# Patient Record
Sex: Female | Born: 1975 | Race: White | Hispanic: No | Marital: Single
Health system: Midwestern US, Academic
[De-identification: ages and names within clinical notes are randomized; demographics above are authoritative.]

---

## 2016-11-24 ENCOUNTER — Encounter: Admit: 2016-11-24 | Discharge: 2016-11-24 | Payer: BC Managed Care – PPO

## 2016-11-24 ENCOUNTER — Emergency Department: Admit: 2016-11-24 | Discharge: 2016-11-24 | Payer: BC Managed Care – PPO

## 2016-11-24 DIAGNOSIS — S42401K Unspecified fracture of lower end of right humerus, subsequent encounter for fracture with nonunion: ICD-10-CM

## 2016-11-24 DIAGNOSIS — S060X1A Concussion with loss of consciousness of 30 minutes or less, initial encounter: Principal | ICD-10-CM

## 2016-11-24 DIAGNOSIS — S12091A Other nondisplaced fracture of first cervical vertebra, initial encounter for closed fracture: ICD-10-CM

## 2016-11-24 MED ORDER — LACTATED RINGERS IV SOLP
1000 mL | INTRAVENOUS | 0 refills | Status: CP
Start: 2016-11-24 — End: ?
  Administered 2016-11-25: 05:00:00 1000 mL via INTRAVENOUS

## 2016-11-24 MED ORDER — FENTANYL CITRATE (PF) 50 MCG/ML IJ SOLN
100 ug | Freq: Once | INTRAVENOUS | 0 refills | Status: CP
Start: 2016-11-24 — End: ?
  Administered 2016-11-25: 04:00:00 100 ug via INTRAVENOUS

## 2016-11-24 MED ORDER — IOPAMIDOL 76 % IV SOLN
80 mL | Freq: Once | INTRAVENOUS | 0 refills | Status: CP
Start: 2016-11-24 — End: ?
  Administered 2016-11-25: 02:00:00 80 mL via INTRAVENOUS

## 2016-11-24 MED ORDER — SODIUM CHLORIDE 0.9 % IJ SOLN
50 mL | Freq: Once | INTRAVENOUS | 0 refills | Status: CP
Start: 2016-11-24 — End: ?
  Administered 2016-11-25: 02:00:00 50 mL via INTRAVENOUS

## 2016-11-24 MED ORDER — FENTANYL CITRATE (PF) 50 MCG/ML IJ SOLN
100 ug | Freq: Once | INTRAVENOUS | 0 refills | Status: CP
Start: 2016-11-24 — End: ?

## 2016-11-25 ENCOUNTER — Encounter: Admit: 2016-11-25 | Discharge: 2016-11-25 | Payer: BC Managed Care – PPO

## 2016-11-25 ENCOUNTER — Inpatient Hospital Stay: Admit: 2016-11-25 | Discharge: 2016-11-25 | Payer: BC Managed Care – PPO

## 2016-11-25 ENCOUNTER — Emergency Department: Admit: 2016-11-25 | Discharge: 2016-11-25 | Payer: BC Managed Care – PPO

## 2016-11-25 DIAGNOSIS — S060X1A Concussion with loss of consciousness of 30 minutes or less, initial encounter: Principal | ICD-10-CM

## 2016-11-25 LAB — TEG WITH KAOLIN
Lab: 0.4 % (ref <8.1)
Lab: 1.4 min (ref <3.1)
Lab: 3.6 min (ref <9.1)
Lab: 5 min (ref <12.1)
Lab: 67 mm (ref >49.9)
Lab: 70 DEG (ref >54.9)

## 2016-11-25 LAB — BASIC METABOLIC PANEL
Lab: 0.7 mg/dL (ref 0.4–1.00)
Lab: 104 MMOL/L (ref 98–110)
Lab: 104 mg/dL — ABNORMAL HIGH (ref 70–100)
Lab: 134 MMOL/L — ABNORMAL LOW (ref 137–147)
Lab: 14 % — ABNORMAL HIGH (ref 3–12)
Lab: 15 mg/dL (ref 7–25)
Lab: 16 MMOL/L — ABNORMAL LOW (ref 21–30)
Lab: 8.4 mg/dL — ABNORMAL LOW (ref 8.5–10.6)
Lab: >60 mL/min (ref >60)
Lab: >60 mL/min (ref >60)
Lab: 135 MMOL/L — ABNORMAL LOW (ref 137–147)
Lab: 4.1 MMOL/L (ref 3.5–5.1)
Lab: 8.5 mg/dL (ref 8.5–10.6)
Lab: >60 mL/min (ref >60)

## 2016-11-25 LAB — CANNABINOIDS-URINE RANDOM: Lab: NEGATIVE

## 2016-11-25 LAB — PHOSPHORUS: Lab: 3.3 mg/dL (ref 2.0–4.5)

## 2016-11-25 LAB — LACTIC ACID (BG - RAPID LACTATE)
Lab: 3.2 MMOL/L — ABNORMAL HIGH (ref 0.5–2.0)
Lab: 3 MMOL/L — ABNORMAL HIGH (ref 0.5–2.0)

## 2016-11-25 LAB — PROTIME INR (PT): Lab: 1 M/UL (ref 0.8–1.2)

## 2016-11-25 LAB — ALCOHOL LEVEL: Lab: 154 mg/dL

## 2016-11-25 LAB — CBC
Lab: 16 10*3/uL — ABNORMAL HIGH (ref 4.5–11.0)
Lab: 13 % (ref 11–15)
Lab: 169 10*3/uL (ref 150–400)
Lab: 30 pg (ref 26–34)
Lab: 34 g/dL (ref 32.0–36.0)
Lab: 36 % (ref 36–45)
Lab: 88 FL (ref 80–100)
Lab: 9.2 FL (ref 7–11)
Lab: 9.6 10*3/uL (ref 4.5–11.0)

## 2016-11-25 LAB — COCAINE-URINE RANDOM: Lab: NEGATIVE

## 2016-11-25 LAB — PTT (APTT): Lab: 21 s (ref 20.0–36.0)

## 2016-11-25 LAB — BARBITURATES-URINE RANDOM: Lab: NEGATIVE

## 2016-11-25 LAB — BENZODIAZEPINES-URINE RANDOM: Lab: NEGATIVE

## 2016-11-25 LAB — PHENCYCLIDINES-URINE RANDOM: Lab: NEGATIVE

## 2016-11-25 LAB — OPIATES-URINE RANDOM: Lab: NEGATIVE

## 2016-11-25 LAB — MAGNESIUM: Lab: 1.6 mg/dL (ref 1.6–2.6)

## 2016-11-25 LAB — AMPHETAMINES-URINE RANDOM: Lab: NEGATIVE

## 2016-11-25 MED ORDER — FENTANYL CITRATE (PF) 50 MCG/ML IJ SOLN
25-50 ug | INTRAVENOUS | 0 refills | Status: DC | PRN
Start: 2016-11-25 — End: 2016-11-25
  Administered 2016-11-25: 08:00:00 50 ug via INTRAVENOUS

## 2016-11-25 MED ORDER — ENOXAPARIN 40 MG/0.4 ML SC SYRG
40 mg | Freq: Two times a day (BID) | SUBCUTANEOUS | 0 refills | Status: DC
Start: 2016-11-25 — End: 2016-11-25

## 2016-11-25 MED ORDER — ONDANSETRON HCL (PF) 4 MG/2 ML IJ SOLN
4-8 mg | INTRAVENOUS | 0 refills | Status: DC | PRN
Start: 2016-11-25 — End: 2016-11-29

## 2016-11-25 MED ORDER — GABAPENTIN 100 MG PO CAP
200 mg | ORAL | 0 refills | Status: DC
Start: 2016-11-25 — End: 2016-11-28
  Administered 2016-11-25 – 2016-11-28 (×9): 200 mg via ORAL

## 2016-11-25 MED ORDER — FENTANYL CITRATE (PF) 50 MCG/ML IJ SOLN
25-50 ug | INTRAVENOUS | 0 refills | Status: DC | PRN
Start: 2016-11-25 — End: 2016-11-29
  Administered 2016-11-27: 02:00:00 25 ug via INTRAVENOUS
  Administered 2016-11-28: 18:00:00 50 ug via INTRAVENOUS

## 2016-11-25 MED ORDER — ACETAMINOPHEN 500 MG PO TAB
1000 mg | ORAL | 0 refills | Status: DC
Start: 2016-11-25 — End: 2016-11-29
  Administered 2016-11-26 – 2016-11-29 (×10): 1000 mg via ORAL

## 2016-11-25 MED ORDER — METHOCARBAMOL 500 MG PO TAB
500 mg | Freq: Three times a day (TID) | ORAL | 0 refills | Status: DC
Start: 2016-11-25 — End: 2016-11-27
  Administered 2016-11-25 – 2016-11-27 (×5): 500 mg via ORAL

## 2016-11-25 MED ORDER — ENOXAPARIN 30 MG/0.3 ML SC SYRG
30 mg | Freq: Two times a day (BID) | SUBCUTANEOUS | 0 refills | Status: DC
Start: 2016-11-25 — End: 2016-11-29
  Administered 2016-11-26 – 2016-11-29 (×5): 30 mg via SUBCUTANEOUS

## 2016-11-25 MED ORDER — ACETAMINOPHEN 325 MG PO TAB
650 mg | ORAL | 0 refills | Status: DC | PRN
Start: 2016-11-25 — End: 2016-11-25

## 2016-11-25 MED ORDER — ENOXAPARIN 40 MG/0.4 ML SC SYRG
40 mg | Freq: Every day | SUBCUTANEOUS | 0 refills | Status: DC
Start: 2016-11-25 — End: 2016-11-25

## 2016-11-25 MED ORDER — POLYETHYLENE GLYCOL 3350 17 GRAM PO PWPK
1 | Freq: Every day | ORAL | 0 refills | Status: DC
Start: 2016-11-25 — End: 2016-11-28

## 2016-11-25 MED ORDER — ENOXAPARIN 30 MG/0.3 ML SC SYRG
30 mg | Freq: Two times a day (BID) | SUBCUTANEOUS | 0 refills | Status: DC
Start: 2016-11-25 — End: 2016-11-25

## 2016-11-25 MED ORDER — GADOBENATE DIMEGLUMINE 529 MG/ML (0.1MMOL/0.2ML) IV SOLN
15 mL | Freq: Once | INTRAVENOUS | 0 refills | Status: CP
Start: 2016-11-25 — End: ?
  Administered 2016-11-25: 19:00:00 15 mL via INTRAVENOUS

## 2016-11-25 MED ORDER — OXYCODONE 5 MG PO TAB
5-15 mg | ORAL | 0 refills | Status: DC | PRN
Start: 2016-11-25 — End: 2016-11-29
  Administered 2016-11-25 – 2016-11-26 (×4): 15 mg via ORAL
  Administered 2016-11-26: 12:00:00 10 mg via ORAL
  Administered 2016-11-27 – 2016-11-28 (×4): 15 mg via ORAL
  Administered 2016-11-28: 11:00:00 10 mg via ORAL
  Administered 2016-11-29 (×5): 15 mg via ORAL

## 2016-11-25 MED ORDER — MAGNESIUM SULFATE IN WATER 4 GRAM/50 ML (8 %) IV PGBK
4 g | Freq: Once | INTRAVENOUS | 0 refills | Status: CP
Start: 2016-11-25 — End: ?
  Administered 2016-11-25: 17:00:00 4 g via INTRAVENOUS

## 2016-11-25 MED ORDER — MELATONIN 3 MG PO TAB
3 mg | Freq: Every evening | ORAL | 0 refills | Status: DC
Start: 2016-11-25 — End: 2016-11-29
  Administered 2016-11-26 – 2016-11-29 (×4): 3 mg via ORAL

## 2016-11-25 MED ORDER — SENNOSIDES-DOCUSATE SODIUM 8.6-50 MG PO TAB
2 | Freq: Two times a day (BID) | ORAL | 0 refills | Status: DC
Start: 2016-11-25 — End: 2016-11-29
  Administered 2016-11-26 – 2016-11-29 (×5): 2 via ORAL

## 2016-11-25 MED ORDER — KETAMINE 10 MG/ML IJ SOLN
25 mg | Freq: Once | INTRAVENOUS | 0 refills | Status: CP
Start: 2016-11-25 — End: ?
  Administered 2016-11-25: 05:00:00 25 mg via INTRAVENOUS

## 2016-11-25 MED ORDER — PROPOFOL 10 MG/ML IV EMUL
25 mg | INTRAVENOUS | 0 refills | Status: DC
Start: 2016-11-25 — End: 2016-11-25
  Administered 2016-11-25 (×4): 25 mg via INTRAVENOUS

## 2016-11-25 MED ORDER — LACTATED RINGERS IV SOLP
INTRAVENOUS | 0 refills | Status: DC
Start: 2016-11-25 — End: 2016-11-25
  Administered 2016-11-25: 09:00:00 1000.000 mL via INTRAVENOUS

## 2016-11-25 MED ORDER — OXYCODONE 5 MG PO TAB
5-10 mg | ORAL | 0 refills | Status: DC | PRN
Start: 2016-11-25 — End: 2016-11-25
  Administered 2016-11-25: 14:00:00 10 mg via ORAL

## 2016-11-25 MED ADMIN — FENTANYL CITRATE (PF) 50 MCG/ML IJ SOLN [3037]: 100 ug | INTRAVENOUS | @ 03:00:00 | Stop: 2016-11-25 | NDC 00409909409

## 2016-11-25 NOTE — ED Notes
Patient Belongings:    Pair of underwear  Cut bra  Cut T-shirt  Cut long undershirt  cut sweatshirt   Cut [place] jacket  Knit beanie  Mints

## 2016-11-25 NOTE — Other
11/24/2016 [place], [place] [id number]    Attending of Record: Allyson Sabal  Performing Provider: Mare Ludtke    Procedure: Limited Abdominal Ultrasound (Trauma FAST exam)    Areas viewed: Right Lateral window, Left Lateral window, Sub-xyphoid/pericardial window and Pelvic window    Indication for Procedure: blunt abdominal trauma    Findings: Negx4    Plan from Findings: Proceed with workup     Durwin Reges, MD

## 2016-11-25 NOTE — Progress Notes
PHYSICAL THERAPY  NOTE         Patient was unavailable for physical therapy-fitting for Aspen collar, off unit to MRI.  Physical therapy will continue to follow and provide intervention as indicated.    Therapist: Era Bumpers, PT  Date: 11/25/2016

## 2016-11-25 NOTE — ED Notes
Pt report called to [employer] for [id number].

## 2016-11-25 NOTE — ED Notes
Report to Tim, RN
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
## 2016-11-25 NOTE — Progress Notes
Patient arrived to room # (5113/1) via cart accompanied by RN. Patient transferred to the bed with assistance. Bedside safety checks completed. Initial patient assessment completed, refer to flowsheet for details. Admission skin assessment completed by:   Vania Rea and Shawn B.  Pressure Injury Present on Hospital Admission (within 24 hours): No    1. Occiput: No  2. Ear: No  3. Scapula: No  4. Spinous Process: No  5. Shoulder: No  6. Elbow: No  7. Iliac Crest: No  8. Sacrum/Coccyx: No  9. Ischial Tuberosity: No  10. Trochanter: No  11. Knee: No  12. Malleolus: No  13. Heel: No  14. Toes: No  15. Assessed for device associated injury No  16. Nursing Nutrition Assessment Completed No    See Doc Flowsheet for additional wound details.     INTERVENTIONS:

## 2016-11-25 NOTE — ED Notes
Pt's sister, Karoline Caldwell, at the bedside

## 2016-11-25 NOTE — Progress Notes
OCCUPATIONAL THERAPY  ASSESSMENT NOTE    Patient Name: Krystal Cruz                   Room/Bed: JY7829/56  Admitting Diagnosis:   No past medical history on file.    Mobility  Progressive Mobility Level: Walk in hallway  Distance Walked (feet): 100 ft  Level of Assistance: Assist X1  Assistive Device: None  Time Tolerated: 0-10 minutes  Activity Limited By: Fatigue    Subjective  Pertinent Dx per Physician: 41 year old female w/ R elbow dislocation w/ associated coronoid fracture and gross instability s/p closed reduction 9/30  Precautions: Standard;Falls;Cervical Collar on at All Times  UE Precautions: RUE Non Weight Bearing;RUE Sling  Pain / Complaints: Patient agrees to participate in therapy  Pain Location: Right;Arm  Pain Level Current:  (Patient did not rate)  Comments: Patient presented supine in bed upon therapist arrival; patient agreeable to OT evaluation. Patient left supine in bed with all needs addressed    Objective  Psychosocial Status: Willing and Cooperative to Participate  Persons Present: Family    Home Living  Type of Home: House  Home Layout: Two Level;Able to Live on Main Level w/Bedrm/Bathrm Access;Stairs to Enter w/ [employer] / Tub: Pension scheme manager: Standard    Prior Function  Level Of Independence: Independent with ADLs and functional transfers;Independent with homemaking w/ ambulation  Lives With: Alone  Other Function Comments: Patient reported that she will be able to have family support upon d/c    Vision  Current Vision: No Visual Deficits    ADL's  Eating Assist: Minimal Assist  Eating Deficits: Beverage Management;Setup  LE Dressing Assist: Total Assist  LE Dressing Deficits: Don/Doff R Sock;Don/Doff L Sock  Functional Transfer Assist: Stand By Assist (CGA)  Comment: Patient SBA to CGA for all functional mobility; min assist required one time for a minor loss of balance. Patient total assist for LE dressing as she did not want to attempt, however, believe she could have done so with min assist. Patient declined further ADLs, ambulating in hallway, then returned to bed with meal setup. Planned OR monday 10/01 for fixation of RUE with ortho.    Cognition  Overall Cognitive Status: WNL    UE AROM  Comment: LUE WNL; RUE not assessed    Edema  RUE Edema: Mild Edema  LUE Edema: No Significant Edema  R Hand Edema: Mild Edema  L Hand Edema: No Significant Edema  Comment: RUE elevated upon therapist departure. Patient able to wiggle all fingers on R hand.    Sensory  Comment: Patient reports numbness/tingling in R hand    UE Strength / Tone  Comment: LUE WNL (not formally assesseD); RUE not assessed due to precautions    Education  Persons Educated: Patient/Family  Barriers To Learning: None Noted  Teaching Methods: Verbal Instruction;Demonstration  Patient Response: Verbalized and Demo Understanding  Topics: Role of OT, Goals for Therapy  Goal Formulation: With Patient/Family    Assessment  Assessment: Decreased ADL Status;Decreased Endurance;Decreased Self-Care Trans;Decreased High-Level ADLs  Prognosis: Good;w/ Family  Goal Formulation: Pt/family    AM-PAC 6 Clicks Daily Activity Inpatient  Putting on and taking off regular lower body clothes?: A Lot  Bathing (Including washing, rinsing, drying): A Little  Toileting, which includes using toilet, bedpan, or urinal: A Little  Putting on and taking off regular upper body clothing: A Little  Taking care of personal grooming such as brushing teeth: A  Little  Eating meals?: A Little  Daily Activity Raw Score: 17  Standardized (t-scale) score: 37.26  CMS 0-100% Score: 50.11  CMS G Code Modifier: CK    Plan   OT Frequency: 5x/week  OT Plan for Next Visit: Follow-up post-op for RE     ADL Goals  Patient Will Perform Grooming: Standing at Sink;w/ Stand By Assist  Patient Will Perform LE Dressing: At [facility] of Bed;In Chair;w/ Minimum Assist Patient Will Perform Toileting: w/ Stand By Assist    Functional Transfer Goals  Pt Will Perform All Functional Transfers: Independent    OT Discharge Recommendations  OT Discharge Recommendations: Home with family assist  Equipment Recommendations: Too early to be determined    Therapist: Bertram Millard, OTR/L [phone]  Date: 11/25/2016

## 2016-11-25 NOTE — Progress Notes
Chaplain Note: Chaplain followed up with pt and family. Pt is in ED 37, prayed with her at her request.    Admit Date: 11/24/2016       Please page or use consult order if pt requests visit. Chaplain will continue to follow.       Date/Time:                      User:                                   11/24/2016 10:46 PM Carroll Kinds      PCU 3 PCU
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
## 2016-11-25 NOTE — ED Notes
BH [phone] (ready)

## 2016-11-25 NOTE — Progress Notes
Orthopedic Surgery Progress Note    Name: Is Northcarolinai   Length of stay: LOS: 0 days    S: No acute events.  Pain well-controlled.      O:  Blood pressure 98/66, pulse 81, temperature 36.9 ???C (98.4 ???F), height 167.6 cm (66), weight 81.6 kg (179 lb 14.3 oz), SpO2 97 %.     General: AAOx3, NAD  Cardiac: RRR  Respirations: Unlabored  Abdomen: soft, nt  Extremities: RUE: splint in place, arm in full pronation. SILT m/u/r nerve distributions. AIN/PIN/ulnar nerve intact. Fingers wwp. Cap refill < 3 sec.  Incisions:  none    Post-op Drains: (24 hours): none    Lab/Radiology/Other Diagnostic Tests:  CBC w/Diff   Lab Results   Component Value Date/Time    WBC 16.7 (H) 11/24/2016 10:05 PM    HGB 13.5 11/24/2016 10:05 PM    HCT 40.7 11/24/2016 10:05 PM    PLTCT 189 11/24/2016 10:05 PM        Inflammatory Markers   No results found for: ESR, CRP     Coagulation Studies   Lab Results   Component Value Date/Time    PTT 21.8 11/24/2016 10:05 PM    INR 1.0 11/24/2016 10:05 PM        Basic Metabolic Profile   Lab Results   Component Value Date/Time    NA 134 (L) 11/24/2016 10:05 PM    K 3.9 11/24/2016 10:05 PM    CL 104 11/24/2016 10:05 PM    CO2 16 (L) 11/24/2016 10:05 PM    GAP 14 (H) 11/24/2016 10:05 PM    BUN 15 11/24/2016 10:05 PM    CR 0.74 11/24/2016 10:05 PM    GLU 104 (H) 11/24/2016 10:05 PM        Radiology: reviewed      Assessment Is Northcarolinai is a 41 year old female w/ R elbow dislocation w/ associated coronoid fracture and gross instability s/p closed reduction 9/30    Plan  WB Status - NWB RUE  ROM Status - maintain splint in full pronaiton  Antibiotics - none necessary from ortho standpoint  Pain Control per primary  Diet please keep NPO @ MN for possible OR tomorrow  PT/OT - consulted  DVT PPX - Mechanical, Chemoprophylaxis per primary  Dispo - Will plan for operative fixation of R elbow 10/1. Patient is consented. Will discuss at conference tomorrow morning re: treating surgeon Will discuss patient with Staff Orthopedic Surgeon Dr. Hattie Perch.    Frances Maywood, MD  Pager [phone]

## 2016-11-26 ENCOUNTER — Encounter: Admit: 2016-11-26 | Discharge: 2016-11-26 | Payer: BC Managed Care – PPO

## 2016-11-26 ENCOUNTER — Inpatient Hospital Stay: Admit: 2016-11-26 | Discharge: 2016-11-26 | Payer: BC Managed Care – PPO

## 2016-11-26 LAB — BASIC METABOLIC PANEL
Lab: 104 MMOL/L — ABNORMAL LOW (ref >60)
Lab: 136 MMOL/L — ABNORMAL LOW (ref 137–147)
Lab: 7 pg (ref 3–12)

## 2016-11-26 LAB — MAGNESIUM: Lab: 1.8 mg/dL — ABNORMAL LOW (ref >60)

## 2016-11-26 LAB — PHOSPHORUS: Lab: 2.6 mg/dL (ref >60)

## 2016-11-26 MED ORDER — DIPHENHYDRAMINE(#)/LIDOCAINE/ANTACID 1:1:1 PO SUSP
10 mL | Freq: Three times a day (TID) | ORAL | 0 refills | Status: DC
Start: 2016-11-26 — End: 2016-11-27
  Administered 2016-11-27: 05:00:00 10 mL via ORAL

## 2016-11-26 MED ORDER — BACITRACIN ZINC 500 UNIT/GRAM TP OINT
Freq: Two times a day (BID) | TOPICAL | 0 refills | Status: DC
Start: 2016-11-26 — End: 2016-11-29
  Administered 2016-11-27: 13:00:00 via TOPICAL

## 2016-11-26 MED ORDER — LACTATED RINGERS IV SOLP
1000 mL | INTRAVENOUS | 0 refills | Status: DC
Start: 2016-11-26 — End: 2016-11-28
  Administered 2016-11-26: 22:00:00 1000 mL via INTRAVENOUS
  Administered 2016-11-27 – 2016-11-28 (×2): 1000.000 mL via INTRAVENOUS

## 2016-11-26 MED ORDER — FLU VACC QS2018-19 6MOS UP(PF) 60 MCG (15 MCG X 4)/0.5 ML IM SYRG
.5 mL | Freq: Once | INTRAMUSCULAR | 0 refills | Status: DC
Start: 2016-11-26 — End: 2016-11-29

## 2016-11-26 NOTE — Progress Notes
OCCUPATIONAL THERAPY  NOTE    Patient to OR this date with ortho. OT will follow-up post-operatively to provide intervention as indicated.    Iisha Soyars, OTR/L [id number]

## 2016-11-26 NOTE — Progress Notes
Orthopedic Surgery Progress Note    Name: Is Northcarolinai   Length of stay: LOS: 1 day    S: No acute events.  Pain well-controlled.      O:  Blood pressure 104/62, pulse 75, temperature 36.7 ???C (98.1 ???F), height 167.6 cm (66), weight 81.6 kg (179 lb 14.3 oz), SpO2 100 %.     General: Alert, NAD  Cardiac: RRR  Respirations: Unlabored  Abdomen: soft, nt  Extremities: RUE: splint in place, arm in full pronation. SILT m/u/r nerve distributions. AIN/PIN/ulnar nerve intact. Fingers wwp. Cap refill < 3 sec. TTP over DIP small/ring/5th MC  Incisions:  none    Post-op Drains: (24 hours): none    Lab/Radiology/Other Diagnostic Tests:  CBC w/Diff   Lab Results   Component Value Date/Time    WBC 9.6 11/25/2016 07:24 AM    HGB 12.6 11/25/2016 07:24 AM    HCT 36.9 11/25/2016 07:24 AM    PLTCT 169 11/25/2016 07:24 AM        Inflammatory Markers   No results found for: ESR, CRP     Coagulation Studies   Lab Results   Component Value Date/Time    PTT 21.8 11/24/2016 10:05 PM    INR 1.0 11/24/2016 10:05 PM        Basic Metabolic Profile   Lab Results   Component Value Date/Time    NA 136 (L) 11/26/2016 05:04 AM    K 3.8 11/26/2016 05:04 AM    CL 104 11/26/2016 05:04 AM    CO2 25 11/26/2016 05:04 AM    GAP 7 11/26/2016 05:04 AM    BUN 13 11/26/2016 05:04 AM    CR 0.63 11/26/2016 05:04 AM    GLU 99 11/26/2016 05:04 AM        Radiology: reviewed      Assessment Is Northcarolinai is a 41 year old female w/ R elbow dislocation w/ associated coronoid fracture and gross instability s/p closed reduction 9/30,  5th metacarpal fx, 4th/5th distal phalanx fxs    Plan  WB Status - NWB RUE  ROM Status - maintain splint in full pronaiton  Possible stack splints and ulnar gutter splint for hand fxs after surgery.   Antibiotics - none necessary from ortho standpoint  Pain Control per primary  Diet please keep NPO   PT/OT - consulted  DVT PPX - Mechanical, Chemoprophylaxis per primary Dispo - Will plan for operative fixation of R elbow 10/1 and possible hand fxs. Patient is consented.       Dickey Gave, MD  Pager [phone]
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
## 2016-11-26 NOTE — Progress Notes
Brief Ortho Note    OR today  Please keep NPO    Krystal Cruz A. Zaxton Angerer, MD  [phone]

## 2016-11-26 NOTE — Progress Notes
PHYSICAL THERAPY  NOTE     Patient to OR this date with ortho. PT will follow-up post-operatively to provide intervention as indicated.      Therapist: Pam Drown, PT  Date: 11/26/2016

## 2016-11-26 NOTE — Progress Notes
Brief Ortho Note    OR today.  Please keep NPO  Posted, consented    Krystal Needle A. Adeoluwa Silvers, MD  [phone]

## 2016-11-27 ENCOUNTER — Encounter: Admit: 2016-11-27 | Discharge: 2016-11-27 | Payer: BC Managed Care – PPO

## 2016-11-27 ENCOUNTER — Inpatient Hospital Stay: Admit: 2016-11-27 | Discharge: 2016-11-27 | Payer: BC Managed Care – PPO

## 2016-11-27 DIAGNOSIS — S060X1A Concussion with loss of consciousness of 30 minutes or less, initial encounter: Principal | ICD-10-CM

## 2016-11-27 LAB — PHOSPHORUS: Lab: 3.5 mg/dL — ABNORMAL LOW (ref >60)

## 2016-11-27 LAB — MAGNESIUM: Lab: 1.9 mg/dL — ABNORMAL LOW (ref 1.6–2.6)

## 2016-11-27 LAB — PREGNANCY TEST-URINE
Lab: 1
Lab: NEGATIVE

## 2016-11-27 LAB — BASIC METABOLIC PANEL: Lab: 139 MMOL/L — ABNORMAL LOW (ref 137–147)

## 2016-11-27 MED ORDER — PROPOFOL INJ 10 MG/ML IV VIAL
0 refills | Status: DC
Start: 2016-11-27 — End: 2016-11-28
  Administered 2016-11-27: 23:00:00 200 mg via INTRAVENOUS

## 2016-11-27 MED ORDER — LIDOCAINE (PF) 200 MG/10 ML (2 %) IJ SYRG
0 refills | Status: DC
Start: 2016-11-27 — End: 2016-11-28
  Administered 2016-11-27: 23:00:00 100 mg via INTRAVENOUS

## 2016-11-27 MED ORDER — LIDOCAINE (PF) 10 MG/ML (1 %) IJ SOLN
.1-2 mL | INTRAMUSCULAR | 0 refills | Status: DC | PRN
Start: 2016-11-27 — End: 2016-11-28

## 2016-11-27 MED ORDER — MIDAZOLAM 1 MG/ML IJ SOLN
INTRAVENOUS | 0 refills | Status: DC
Start: 2016-11-27 — End: 2016-11-28
  Administered 2016-11-27: 23:00:00 2 mg via INTRAVENOUS

## 2016-11-27 MED ORDER — ONDANSETRON HCL (PF) 4 MG/2 ML IJ SOLN
INTRAVENOUS | 0 refills | Status: DC
Start: 2016-11-27 — End: 2016-11-28
  Administered 2016-11-28: 4 mg via INTRAVENOUS

## 2016-11-27 MED ORDER — FENTANYL CITRATE (PF) 50 MCG/ML IJ SOLN
0 refills | Status: DC
Start: 2016-11-27 — End: 2016-11-28
  Administered 2016-11-27 – 2016-11-28 (×8): 25 ug via INTRAVENOUS

## 2016-11-27 MED ORDER — DIPHENHYDRAMINE(#)/LIDOCAINE/ANTACID 1:1:1 PO SUSP
10 mL | Freq: Four times a day (QID) | ORAL | 0 refills | Status: AC | PRN
Start: 2016-11-27 — End: ?

## 2016-11-27 MED ORDER — ACETAMINOPHEN 1,000 MG/100 ML (10 MG/ML) IV SOLN
1000 mg | Freq: Once | INTRAVENOUS | 0 refills | Status: DC
Start: 2016-11-27 — End: 2016-11-28

## 2016-11-27 MED ORDER — OXYCODONE 5 MG PO TAB
5-10 mg | Freq: Once | ORAL | 0 refills | Status: DC | PRN
Start: 2016-11-27 — End: 2016-11-28

## 2016-11-27 MED ORDER — LIDOCAINE (PF) 10 MG/ML (1 %) IJ SOLN
0 refills | Status: DC
Start: 2016-11-27 — End: 2016-11-28
  Administered 2016-11-28: 02:00:00 4 mL

## 2016-11-27 MED ORDER — CEFAZOLIN 1 GRAM IJ SOLR
0 refills | Status: DC
Start: 2016-11-27 — End: 2016-11-28
  Administered 2016-11-27: 23:00:00 2 g via INTRAVENOUS

## 2016-11-27 MED ORDER — METHOCARBAMOL 750 MG PO TAB
750 mg | Freq: Three times a day (TID) | ORAL | 0 refills | Status: DC
Start: 2016-11-27 — End: 2016-11-28
  Administered 2016-11-28 (×3): 750 mg via ORAL

## 2016-11-27 MED ORDER — CEFAZOLIN INJ 1GM IVP
2 g | INTRAVENOUS | 0 refills | Status: CP
Start: 2016-11-27 — End: ?
  Administered 2016-11-28 (×3): 2 g via INTRAVENOUS

## 2016-11-27 MED ORDER — MEPERIDINE (PF) 25 MG/ML IJ SYRG
12.5 mg | INTRAVENOUS | 0 refills | Status: DC | PRN
Start: 2016-11-27 — End: 2016-11-28

## 2016-11-27 MED ORDER — ACETAMINOPHEN 1,000 MG/100 ML (10 MG/ML) IV SOLN
0 refills | Status: DC
Start: 2016-11-27 — End: 2016-11-28
  Administered 2016-11-28: 01:00:00 1000 mg via INTRAVENOUS

## 2016-11-27 MED ORDER — FENTANYL CITRATE (PF) 50 MCG/ML IJ SOLN
50 ug | INTRAVENOUS | 0 refills | Status: DC | PRN
Start: 2016-11-27 — End: 2016-11-28
  Administered 2016-11-28 (×2): 50 ug via INTRAVENOUS

## 2016-11-27 MED ORDER — DEXMEDETOMIDINE# 4MCG/ML IV SOLN
0 refills | Status: DC
Start: 2016-11-27 — End: 2016-11-28
  Administered 2016-11-28 (×3): 4 ug via INTRAVENOUS
  Administered 2016-11-28: 02:00:00 8 ug via INTRAVENOUS

## 2016-11-27 MED ORDER — LACTATED RINGERS IV SOLP
1000 mL | INTRAVENOUS | 0 refills | Status: DC
Start: 2016-11-27 — End: 2016-11-28

## 2016-11-27 MED ORDER — HYDROMORPHONE (PF) 2 MG/ML IJ SYRG
1-2 mg | Freq: Once | INTRAVENOUS | 0 refills | Status: CP
Start: 2016-11-27 — End: ?
  Administered 2016-11-28 (×2): 1 mg via INTRAVENOUS

## 2016-11-27 MED ORDER — HALOPERIDOL LACTATE 5 MG/ML IJ SOLN
1 mg | Freq: Once | INTRAVENOUS | 0 refills | Status: DC | PRN
Start: 2016-11-27 — End: 2016-11-28

## 2016-11-27 MED ORDER — BUPIVACAINE 0.5% 30ML/EPINEPHRINE 0.075MG INJECTION (OR)
0 refills | Status: DC
Start: 2016-11-27 — End: 2016-11-28
  Administered 2016-11-28 (×2): 30 mL

## 2016-11-27 MED ORDER — DEXTRAN 70-HYPROMELLOSE (PF) 0.1-0.3 % OP DPET
0 refills | Status: DC
Start: 2016-11-27 — End: 2016-11-28
  Administered 2016-11-27: 23:00:00 2 [drp] via OPHTHALMIC

## 2016-11-27 MED ORDER — DEXAMETHASONE SODIUM PHOSPHATE 4 MG/ML IJ SOLN
INTRAVENOUS | 0 refills | Status: DC
Start: 2016-11-27 — End: 2016-11-28
  Administered 2016-11-27: 23:00:00 4 mg via INTRAVENOUS
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
## 2016-11-27 NOTE — Progress Notes
To OR for OPEN REDUCTION INTERNAL FIXATION CORONOID FRACTURE, REPAIR LATERAL LIGAMENTOUS COMPLEX, POSSIBLE INTERNAL JOINT STABILIZER PLACEMENT, ORIF of metacarpal fractures    Consented    Krystal Cruz  [phone]

## 2016-11-27 NOTE — Progress Notes
Pt wheeled down via wheelchair for surgery.

## 2016-11-27 NOTE — Progress Notes
OCCUPATIONAL THERAPY  NOTE     Pt is scheduled to got to OR this date. OT to follow-up post operatively. Thank you.    Therapist: Margaretha Glassing, OTR/L [phone]  Date: 11/27/2016

## 2016-11-27 NOTE — Progress Notes
PHYSICAL THERAPY  PROGRESS NOTE       MOBILITY:  Mobility  Progressive Mobility Level: Walk laps  Distance Walked (feet): 350 ft  Level of Assistance: Stand by assistance  Assistive Device: None  Time Tolerated: 11-30 minutes  Activity Limited By: Pain    SUBJECTIVE:  Subjective  Significant hospital events: MCA: C1 fracture, R elbow dislocation, +LOC  Mental / Cognitive Status: Alert;Oriented;Cooperative  Persons Present: Daughter  Comments: Pt complains of pain 4/10 in fingers, 8/10 elbow and cervical stiffness  Comments: cervical collar removed, no cervical restrictions  UE Precautions: RUE Non Weight Bearing;RUE Sling  Ambulation Assist: Independent Mobility in Community without Device  Patient Owned Equipment: None  Home Situation: Lives Alone  Type of Home: House  Entry Stairs: 3-5 Stairs;Rail on Both Sides  In-Home Stairs: 1-2 Flights of Stairs;Rail on 1 Side  Comment: Pt reports her daughter can help her as needed.    ROM:  ROM  LE ROM WFL: Yes    STRENGTH:  Strength  R LE WNL: Yes  L LE WNL: Yes    POSTURE/NEURO:  Posture / Neurological  Posture/Neuro Comments: Pt denies decreased sensation L from R    BED MOBILITY/TRANSFERS:  Bed Mobility/Transfers  Bed Mobility: Supine to Sit: Standby Assist  Bed Mobility: Sit to Supine: Standby Assist  Transfer Type: Sit to/from Stand  Transfer: Assistance Level: To/From;Bed;Standby Assist  Transfer: Assistive Device: None    GAIT:  Gait  Gait Distance: 350 feet  Gait: Assistance Level: Higher education careers adviser: Assistive Device: None  Gait: Descriptors: Pace: Normal  Stairs: Number Climbed: 5  Stairs: Descriptors: Ascend;Descend;Reciprocal  Stairs: Assistance Level: Standby Assist    EDUCATION:  Education  Comments: Non weight bearing, no cervical restrictions with motion    ASSESSMENT/PROGRESS:  Assessment/Progress  Comments: Pt demonstrates limited by pain.  AM-PAC 6 Clicks Basic Mobility Inpatient  Turning from your back to your side while in a flat bed without using bed rails: None  Moving from lying on your back to sitting on the side of a flatbed without using bedrails : None  Moving to and from a bed to a chair (including a wheelchair): None  Standing up from a chair using your arms (e.g. wheelchair, or bedside chair): None  To walk in hospital room: None  Climbing 3-5 steps with a railing: None  Raw Score: 24  Standardized (T-scale) Score: 57.68  Basic Mobility CMS 0-100%: 0  CMS G Code Modifier for Basic Mobility: CH    GOALS:  Goals  Goal Formulation: With Patient/Family  Time For Goal Achievement: 5 days  Pt Will Transfer Sit to Stand: w/ Stand By Assist, Met  Pt Will Ambulate: Greater than 200 Feet, w/ No Device, w/ Stand By Assist, Met  Pt Will Go Up / Down Stairs: 3-5 Stairs, w/ Stand By Assist, Met    PLAN:  Plan   Treatment Interventions: Mobility Training;Balance Activities  Plan Frequency: 5-7 Days per Week  PT Plan for Next Visit: check in with pt following surgery, PT does not anticipate physical therapy needs following surgery, daughter able to help PRN at home, likely limited by pain.    RECOMMENDATIONS:  PT Discharge Recommendations  PT Discharge Recommendations: Home with Assistance  Equipment Recommendations: None    Therapist: Angelia Mould, PT DPT [phone]  Date: 11/27/2016
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
## 2016-11-27 NOTE — Case Management (ED)
Discharge Planning    No acute events overnight. Neuro intact. Pain control with tylenol, fentanyl, gabapentin, robaxin, oxycodone- melatonin for sleep. VSS- RA. NPO for OR today- bowel regimen- last BM PTA. Voids. Lovenox ppx. OT/PT- walk in hallway 168ft with assist x1 on 9/30- rec home with assist.     Aspen removed after flex ex films per NS.     RUE NWB    Sonia Baller, RN, BSN  Trauma Case Manager  Phone: [phone]  Pager: [phone]  Fax: [phone]

## 2016-11-27 NOTE — Progress Notes
Brief Ortho Note    OR today  Please keep NPO    Casimiro Needle A. Zenith Lamphier, MD  [phone]

## 2016-11-27 NOTE — Progress Notes
PHYSICAL THERAPY  NOTE             Pt planned for OR, PT to follow up postoperatively for assessment as appropriate.      Therapist: Angelia Mould, PT DPT [phone]  Date: 11/27/2016

## 2016-11-28 LAB — PHOSPHORUS: Lab: 3.4 mg/dL — ABNORMAL LOW (ref 2.0–4.5)

## 2016-11-28 LAB — BASIC METABOLIC PANEL
Lab: 136 MMOL/L — ABNORMAL LOW (ref 137–147)
Lab: 3.9 MMOL/L — ABNORMAL HIGH (ref 3.5–5.1)

## 2016-11-28 LAB — MAGNESIUM: Lab: 1.7 mg/dL — ABNORMAL LOW (ref 1.6–2.6)

## 2016-11-28 MED ORDER — GABAPENTIN 400 MG PO CAP
400 mg | ORAL | 0 refills | Status: DC
Start: 2016-11-28 — End: 2016-11-29
  Administered 2016-11-29 (×2): 400 mg via ORAL

## 2016-11-28 MED ORDER — POLYETHYLENE GLYCOL 3350 17 GRAM PO PWPK
1 | Freq: Two times a day (BID) | ORAL | 0 refills | Status: DC
Start: 2016-11-28 — End: 2016-11-29
  Administered 2016-11-28 – 2016-11-29 (×2): 17 g via ORAL

## 2016-11-28 MED ORDER — METHOCARBAMOL 500 MG PO TAB
1000 mg | Freq: Four times a day (QID) | ORAL | 0 refills | Status: DC
Start: 2016-11-28 — End: 2016-11-29
  Administered 2016-11-28 – 2016-11-29 (×3): 1000 mg via ORAL

## 2016-11-28 MED ORDER — MAGNESIUM HYDROXIDE 2,400 MG/10 ML PO SUSP
20 mL | Freq: Two times a day (BID) | ORAL | 0 refills | Status: DC
Start: 2016-11-28 — End: 2016-11-29
  Administered 2016-11-28 – 2016-11-29 (×2): 20 mL via ORAL
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
## 2016-11-28 NOTE — Progress Notes
I have reviewed the notes, assessment, and/or procedures performed by Brooke H, RN and concur with her documentation unless otherwise noted
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
## 2016-11-28 NOTE — Progress Notes
Ortho Post-op Note    - NWB RUE  - Keep splint on and clean  - 24 hours of perioperative ancef, will order  - Will cont to follow    [name]

## 2016-11-28 NOTE — Anesthesia Pain Rounding
Anesthesia Follow-Up Evaluation: Post-Procedure Day One    Name: Krystal Cruz     MRN: [id number]     DOB: 01-07-1976     Age: 41 y.o.     Sex: female   __________________________________________________________________________     Procedure Date: 11/27/2016   Procedure: Procedure(s):  OPEN REDUCTION INTERNAL FIXATION RIGHT FIFTH METACARPAL FRACTURE. CLOSED REDUCTION PERCUTANEOUS PINNING OF RIGHT SMALL AND RING FINGER DISTAL PHALANX.  REPAIR LATERAL LIGAMENTOUS COMPLEX.    Physical Assessment  Height: 167.6 cm (66)  Weight: 81.6 kg (179 lb 14.3 oz)    Vital Signs (Last Filed in 24 hours)  BP: 93/61 (10/03 0412)  Temp: 37.2 ???C (99 ???F) (10/03 8469)  Pulse: 71 (10/03 0412)  Respirations: 16 PER MINUTE (10/03 0412)  SpO2: 94 % (10/03 0412)  O2 Delivery: None (Room Air) (10/03 0412)  SpO2 Pulse: 87 (10/02 2145)    Patient History   Allergies  No Known Allergies     Medications  Scheduled Meds:  acetaminophen (TYLENOL) tablet 1,000 mg 1,000 mg Oral Q6H*   bacitracin topical ointment  Topical BID   ceFAZolin (ANCEF) IVP 2 g 2 g Intravenous Q8H*   enoxaparin (LOVENOX) syringe 30 mg 30 mg Subcutaneous BID   gabapentin (NEURONTIN) capsule 200 mg 200 mg Oral Q8H   influenza (=>6 MO)(QUADrivalent) (FLULAVAL) 60 mcg (15 mcg x 4)/0.5 mL 2018-19 PF syringe 0.5 mL 0.5 mL Intramuscular ONCE   melatonin tablet 3 mg 3 mg Oral QHS   methocarbamol (ROBAXIN) tablet 750 mg 750 mg Oral TID   milk of magnesia (CONC) oral suspension 20 mL 20 mL Oral BID   polyethylene glycol 3350 (MIRALAX) packet 17 g 1 packet Oral BID   senna/docusate (SENOKOT-S) tablet 2 tablet 2 tablet Oral BID   Continuous Infusions:  PRN and Respiratory Meds:diphenhydrAMINE/lidocaine/antacid (#) QID PRN, fentaNYL citrate PF Q2H PRN, ondansetron (ZOFRAN) IV Q6H PRN, oxyCODONE Q3H PRN      Diagnostic Tests  Hematology: Lab Results   Component Value Date    HGB 12.6 11/25/2016    HCT 36.9 11/25/2016    PLTCT 169 11/25/2016    WBC 9.6 11/25/2016    MCV 88.2 11/25/2016 MCH 30.1 11/25/2016    MCHC 34.2 11/25/2016    MPV 9.2 11/25/2016    RDW 13.4 11/25/2016         General Chemistry: Lab Results   Component Value Date    NA 136 11/28/2016    K 3.9 11/28/2016    CL 105 11/28/2016    CO2 25 11/28/2016    GAP 6 11/28/2016    BUN 14 11/28/2016    CR 0.70 11/28/2016    GLU 131 11/28/2016    CA 8.5 11/28/2016    MG 1.7 11/28/2016    PO4 3.4 11/28/2016      Coagulation:   Lab Results   Component Value Date    PTT 21.8 11/24/2016    INR 1.0 11/24/2016         Follow-Up Assessment  Patient location during evaluation: floor      Anesthetic Complications:   Anesthetic complications: The patient did not experience any anesthestic complications.      Pain:  Score: 0    Management:adequate     Level of Consciousness: awake and alert   Hydration:acceptable     Airway Patency: patent   Respiratory Status: acceptable     Cardiovascular Status:acceptable   Regional/Neuroaxial:

## 2016-11-28 NOTE — Case Management (ED)
Discharge Planning    No acute events overnight. Neuro intact. Pain control with tylenol, gabapentin, robaxin, oxycodone- melatonin for sleep. VSS- RA. Regular diet- bowel regimen- last BM PTA. Voids. Lovenox ppx. Antibiotic coverage with ancef- last dose tonight at 1800. OT/PT- walking laps with therapy SBA- rec home with assist- outpatient setting.     RUE NWB- sling    Sonia Baller, RN, BSN  Trauma Case Manager  Phone: [phone]  Pager: [phone]  Fax: [phone]

## 2016-11-28 NOTE — Other
Brief Operative Note    Name: KEIERA STRATHMAN is a 41 y.o. female     DOB: 09/23/1975             MRN#: [id number]  DATE OF OPERATION: 11/27/2016    Date:  11/27/2016        Preoperative Dx:   Closed fracture dislocation of right elbow, initial encounter [S42.401A]    Post-op Diagnosis      * Closed fracture dislocation of right elbow, initial encounter [S42.401A]    Procedure(s) (LRB):  OPEN REDUCTION INTERNAL FIXATION RIGHT FIFTH METACARPAL FRACTURE. CLOSED REDUCTION PERCUTANEOUS PINNING OF RIGHT SMALL AND RING FINGER DISTAL PHALANX.  REPAIR LATERAL LIGAMENTOUS COMPLEX. (Right)    Anesthesia Type: Defer to Anesthesia    Surgeon(s) and Role:     * Dorothea Glassman, MD - Resident - Assisting     * Cleda Clarks, MD - Resident - Assisting     * Annia Belt, Meredeth Ide, MD - Primary      Findings:  Stable elbow after repair    Estimated Blood Loss: No blood loss documented.     Specimen(s) Removed/Disposition: * No specimens in log *    Complications:  None    Implants: metacarpal plate, 2 K wires    Drains: None    Disposition:  PACU - stable    Cleda Clarks, MD  Pager

## 2016-11-28 NOTE — Progress Notes
PHYSICAL THERAPY  DISCHARGE NOTE       MOBILITY:  Mobility  Progressive Mobility Level: Walk laps  Distance Walked (feet): 300 ft  Level of Assistance: Stand by assistance  Assistive Device: None  Time Tolerated: 0-10 minutes  Activity Limited By: No limitations    SUBJECTIVE:  Subjective  Significant hospital events: MCA: C1 fracture, R elbow dislocation, +LOC, s/p 10/2 R hand ORIF, and lateral ligament complex repair.  Mental / Cognitive Status: Alert;Oriented;Cooperative  Pain: Patient has no complaint of pain  Pain Interventions: Patient agrees to participate in therapy  UE Precautions: RUE Non Weight Bearing;RUE Sling (sling for comfort)  Ambulation Assist: Independent Mobility in Community without Device  Patient Owned Equipment: None  Home Situation: Lives Alone  Type of Home: House  Entry Stairs: 3-5 Stairs;Rail on Both Sides  In-Home Stairs: 1-2 Flights of Stairs;Rail on 1 Side  Comments: Pt reports her daughter can help her as needed.    BED MOBILITY/TRANSFERS:  Bed Mobility/Transfers  Bed Mobility: Supine to Sit: Modified Independent;Head of Bed Elevated  Bed Mobility: Sit to Supine: Modified Independent;HOB Elevated  Transfer Type: Sit to Stand  Transfer: Assistance Level: To/From;Bed;Independent  Transfer: Assistive Device: None    BALANCE:  Balance  Standing Balance: Dynamic Standing Balance;No UE support;Independent    GAIT:  Gait  Gait Distance: 300 feet  Gait: Assistance Level: Independent  Gait: Assistive Device: None  Gait: Descriptors: Pace: Normal    EDUCATION:  Education  Comments: R UE non weight bearing    ASSESSMENT/PROGRESS:  Assessment/Progress  Comments: Pt has demonstrated stairs, no balance loss or dizziness with mobility, PT to sign off with no mobility needs at the time  AM-PAC 6 Clicks Basic Mobility Inpatient  Turning from your back to your side while in a flat bed without using bed rails: None  Moving from lying on your back to sitting on the side of a flatbed without using bedrails : None  Moving to and from a bed to a chair (including a wheelchair): None  Standing up from a chair using your arms (e.g. wheelchair, or bedside chair): None  To walk in hospital room: None  Climbing 3-5 steps with a railing: None  Raw Score: 24  Standardized (T-scale) Score: 57.68  Basic Mobility CMS 0-100%: 0  CMS G Code Modifier for Basic Mobility: CH    GOALS:  Goals  Goal Formulation: With Patient/Family  Time For Goal Achievement: 5 days  Pt Will Transfer Sit to Stand: w/ Stand By Assist, Met  Pt Will Ambulate: Greater than 200 Feet, w/ No Device, w/ Stand By Assist, Met  Pt Will Go Up / Down Stairs: 3-5 Stairs, w/ Stand By Assist, Met    PLAN:  Plan   Plan Frequency: No Further Treatment    RECOMMENDATIONS:  PT Discharge Recommendations  PT Discharge Recommendations: Home with Assistance, PRN from daughter  Equipment Recommendations: None    Therapist: Angelia Mould, PT DPT [phone]  Date: 11/28/2016

## 2016-11-28 NOTE — Anesthesia Procedure Notes
Anesthesia Procedure: Peripheral Nerve Block    PERIPHERAL NERVE BLOCK  Date/Time: 11/27/2016 9:30 PM    Patient location: post-op  Reason for block: at surgeon's request and post-op pain management    Preprocedure checklist performed: 2 patient identifiers, risks & benefits discussed, patient evaluated, timeout performed, consent obtained and patient being monitored    Sterile technique:  - Proper hand washing  - Cap, mask  - Sterile gloves  - Skin prep for antisepsis        Peripheral Nerve Block Procedure   Patient position: supine  Prep: ChloraPrep    Monitoring: BP, EKG and continuous pulse ox  Block type: supraclavicular  Laterality: right  Injection technique: single-shot  Procedures: ultrasound guided    Ultrasound image captured  Local infiltration: lidocaine    Needle/cathether:      Needle type: Stimuplex      Needle gauge: 22 G; Needle length: 4 in     Needle location: anatomical landmarks and ultrasound guidance    Procedure Outcome   Injection assessment: negative aspiration for heme, no paresthesia on injection, incremental injection and local visualized surrounding nerve on ultrasound  Observations: adequate block, patient tolerated the procedure well with no immediate complications, comfortable throughout block and no sedation      Refer to nursing documentation for vitals and monitoring data during procedure.    Performed by: [facility]  Authorized by: Dionicio Stall

## 2016-11-28 NOTE — Anesthesia Post-Procedure Evaluation
Post-Anesthesia Evaluation    Name: Krystal Cruz      MRN: [id number]     DOB: [birth year]     Age: 41 y.o.     Sex: female   __________________________________________________________________________     Procedure Date: 11/27/2016  Procedure: Procedure(s):  OPEN REDUCTION INTERNAL FIXATION RIGHT FIFTH METACARPAL FRACTURE. CLOSED REDUCTION PERCUTANEOUS PINNING OF RIGHT SMALL AND RING FINGER DISTAL PHALANX.  REPAIR LATERAL LIGAMENTOUS COMPLEX.      Surgeon: Surgeon(s):  Letitia Neri, MD  Dorothea Glassman, MD  Cleda Clarks, MD    Post-Anesthesia Vitals  BP: 102/72 [phone] 2140)  Pulse: 78 (10/02 2140)  Respirations: 16 PER MINUTE (10/02 2140)  SpO2: 97 % (10/02 2140)  O2 Delivery: None (Room Air) (10/02 2140)  SpO2 Pulse: 78 (10/02 2130)      Post Anesthesia Evaluation Note    Evaluation location: Pre/Post  Patient participation: recovered; patient participated in evaluation  Level of consciousness: alert    Pain score: 0  Pain management: adequate    Hydration: normovolemia  Temperature: 36.0C - 38.4C  Airway patency: adequate    Regional/Neuraxial:       Neurological status: sensory deficit      Single injection shot performed    Perioperative Events  Perioperative events:  no       Post-op nausea and vomiting: no PONV    Postoperative Status  Cardiovascular status: hemodynamically stable  Respiratory status: spontaneous ventilation  Follow-up needed: none        Perioperative Events  Perioperative Event: No  Emergency Case Activation: No

## 2016-11-29 ENCOUNTER — Emergency Department: Admit: 2016-11-24 | Discharge: 2016-11-24 | Payer: BC Managed Care – PPO

## 2016-11-29 ENCOUNTER — Inpatient Hospital Stay
Admit: 2016-11-25 | Discharge: 2016-11-29 | Disposition: A | Discharge status: Home / Self Care | Payer: BC Managed Care – PPO | Admitting: Trauma Surgery

## 2016-11-29 ENCOUNTER — Encounter: Admit: 2016-11-29 | Discharge: 2016-11-29 | Payer: BC Managed Care – PPO

## 2016-11-29 ENCOUNTER — Ambulatory Visit: Admit: 2016-11-27 | Discharge: 2016-11-27 | Payer: BC Managed Care – PPO

## 2016-11-29 ENCOUNTER — Inpatient Hospital Stay: Admit: 2016-11-26 | Discharge: 2016-11-26 | Payer: BC Managed Care – PPO

## 2016-11-29 ENCOUNTER — Emergency Department: Admit: 2016-11-25 | Discharge: 2016-11-25 | Payer: BC Managed Care – PPO

## 2016-11-29 DIAGNOSIS — R40242 Glasgow coma scale score 9-12, unspecified time: ICD-10-CM

## 2016-11-29 DIAGNOSIS — G8911 Acute pain due to trauma: ICD-10-CM

## 2016-11-29 DIAGNOSIS — S53104A Unspecified dislocation of right ulnohumeral joint, initial encounter: ICD-10-CM

## 2016-11-29 DIAGNOSIS — S5001XA Contusion of right elbow, initial encounter: ICD-10-CM

## 2016-11-29 DIAGNOSIS — S0081XA Abrasion of other part of head, initial encounter: ICD-10-CM

## 2016-11-29 DIAGNOSIS — S62306A Unspecified fracture of fifth metacarpal bone, right hand, initial encounter for closed fracture: ICD-10-CM

## 2016-11-29 DIAGNOSIS — R740 Nonspecific elevation of levels of transaminase and lactic acid dehydrogenase [LDH]: ICD-10-CM

## 2016-11-29 DIAGNOSIS — Z7409 Other reduced mobility: ICD-10-CM

## 2016-11-29 DIAGNOSIS — S060X1A Concussion with loss of consciousness of 30 minutes or less, initial encounter: Principal | ICD-10-CM

## 2016-11-29 DIAGNOSIS — S12040A Displaced lateral mass fracture of first cervical vertebra, initial encounter for closed fracture: ICD-10-CM

## 2016-11-29 DIAGNOSIS — E871 Hypo-osmolality and hyponatremia: ICD-10-CM

## 2016-11-29 DIAGNOSIS — S42401A Unspecified fracture of lower end of right humerus, initial encounter for closed fracture: ICD-10-CM

## 2016-11-29 DIAGNOSIS — G479 Sleep disorder, unspecified: ICD-10-CM

## 2016-11-29 MED ORDER — OXYCODONE 5 MG PO TAB
5-10 mg | ORAL_TABLET | ORAL | 0 refills | 6.00000 days | Status: AC | PRN
Start: 2016-11-29 — End: 2017-01-09
  Filled 2016-11-29 (×2): qty 60, 4d supply, fill #1

## 2016-11-29 MED ORDER — POLYETHYLENE GLYCOL 3350 17 GRAM PO PWPK
17 g | Freq: Two times a day (BID) | ORAL | 0 refills | 18.00000 days | Status: AC
Start: 2016-11-29 — End: 2017-01-09

## 2016-11-29 MED ORDER — ACETAMINOPHEN 500 MG PO TAB
1000 mg | ORAL | 0 refills | Status: AC | PRN
Start: 2016-11-29 — End: ?

## 2016-11-29 MED ORDER — BISACODYL 5 MG PO TBEC
10 mg | Freq: Every day | ORAL | 0 refills | Status: DC
Start: 2016-11-29 — End: 2016-11-29
  Administered 2016-11-29: 14:00:00 10 mg via ORAL

## 2016-11-29 MED ORDER — METHOCARBAMOL 500 MG PO TAB
1000 mg | ORAL_TABLET | Freq: Four times a day (QID) | ORAL | 1 refills | Status: AC
Start: 2016-11-29 — End: 2016-11-30
  Filled 2016-11-29 (×2): qty 240, 5d supply, fill #1

## 2016-11-29 MED ORDER — BACITRACIN ZINC 500 UNIT/GRAM TP OINT
0 refills | 14.00000 days | Status: AC
Start: 2016-11-29 — End: 2017-01-09

## 2016-11-29 MED ORDER — MELATONIN 3 MG PO TAB
3 mg | Freq: Every evening | ORAL | 0 refills | 28.00000 days | Status: AC
Start: 2016-11-29 — End: 2017-01-09

## 2016-11-29 MED ORDER — SENNOSIDES-DOCUSATE SODIUM 8.6-50 MG PO TAB
2 | Freq: Two times a day (BID) | ORAL | 0 refills | Status: AC
Start: 2016-11-29 — End: 2017-01-09

## 2016-11-29 MED ORDER — GABAPENTIN 400 MG PO CAP
400 mg | ORAL_CAPSULE | ORAL | 1 refills | Status: AC
Start: 2016-11-29 — End: ?
  Filled 2016-11-29 (×2): qty 90, 30d supply, fill #1

## 2016-11-29 NOTE — Progress Notes
Autumn Patty discharged on 11/29/2016.   Marland Kitchen  Discharge instructions reviewed with patient.  Valuables returned:   Personal Items / Valuables: Eyeglasses/Contacts  Where Are Valuables Stored?: patients room.  Home medications:    .  Functional assessment at discharge complete: Yes .

## 2016-11-29 NOTE — Case Management (ED)
Discharge planning    Pt medically ready for DC today. Has support at home. Ambulating independently- therapy recommends outpatient therapy once RUE cleared. Has insurance coverage for Rx.     RUE NWB sling    Sonia Baller, RN, BSN  Trauma Case Manager  Phone: [phone]  Pager: [phone]  Fax: [phone]

## 2016-11-29 NOTE — Discharge Instructions - Pharmacy
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 a lock box or similar container.  *Make sure your opioids are going to be secure, especially if you are around children or teens.  *Talk with your doctor or pharmacist before you take other medications.  *Avoid driving, operating machinery, or drinking alcohol while taking opioid pain medication.  This may be unsafe.    Pain medications can cause constipation. Constipation is bowel movements that are less often than normal. Stools often become very hard and difficult to pass. This may lead to stomach pain and bloating. It may also cause pain when trying to use the bathroom. Constipation may be treated with suppositories, laxatives or stool softeners. A diet high in fiber with plenty of fluids helps to maintain regular, soft bowel movements.        Current Discharge Medication List       START taking these medications    Details   acetaminophen (TYLENOL) 500 mg tablet Take two tablets by mouth every 6 hours as needed for Pain. Max of 4,000 mg of acetaminophen in 24 hours.  Refills: 0    PRESCRIPTION TYPE:  OTC bacitracin 500 unit/g topical ointment Apply to facial abrasions    PRESCRIPTION TYPE:  OTC      gabapentin (NEURONTIN) 400 mg capsule Take one capsule by mouth every 8 hours for 60 days.  Qty: 90 capsule,  Refills: 1    PRESCRIPTION TYPE:  Print      melatonin 3 mg tab Take one tablet by mouth at bedtime daily.    PRESCRIPTION TYPE:  OTC      methocarbamol (ROBAXIN) 500 mg tablet Take two tablets by mouth four times daily for 60 days.  Qty: 240 tablet, Refills: 1    PRESCRIPTION TYPE:  Print      oxyCODONE (ROXICODONE, OXY-IR) 5 mg tablet Take one tablet to two tablets by mouth every 3 hours as needed  Qty: 60 tablet, Refills: 0    PRESCRIPTION TYPE:  Print      polyethylene glycol 3350 (MIRALAX) 17 g packet Take one packet by mouth twice daily.  Qty: 12 each    PRESCRIPTION TYPE:  OTC      senna/docusate (SENOKOT-S) 8.6/50 mg tablet Take two tablets by mouth twice daily.  Refills: 0    PRESCRIPTION TYPE:  OTC          CONTINUE these medications which have NOT CHANGED    Details   [id number] (AIRBORNE (ASCORBATE SODIUM)) 333-1.7 mg chew Chew 1 each by mouth daily.    PRESCRIPTION TYPE:  Historical Med          The following medications were removed from your list. This list includes medications discontinued this stay and those removed from your prior med list in our system        ibuprofen (ADVIL) 200 mg tablet                 Pending items needing follow up: Ortho, Dental    Signed:  Oda Cogan, APRN  11/29/2016      cc:  Primary Care Physician:  [facility]   No PCP  Referring physicians:  Self, Referral   Additional provider(s):
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
## 2016-11-30 ENCOUNTER — Encounter: Admit: 2016-11-30 | Discharge: 2016-11-30 | Payer: BC Managed Care – PPO

## 2016-11-30 MED ORDER — CYCLOBENZAPRINE 10 MG PO TAB
10 mg | ORAL_TABLET | Freq: Three times a day (TID) | ORAL | 0 refills | 30.00000 days | Status: DC | PRN
Start: 2016-11-30 — End: 2017-12-17
  Filled 2016-11-30 (×2): qty 90, 30d supply, fill #1

## 2016-12-03 ENCOUNTER — Encounter: Admit: 2016-12-03 | Discharge: 2016-12-03 | Payer: BC Managed Care – PPO

## 2016-12-03 DIAGNOSIS — S5001XD Contusion of right elbow, subsequent encounter: Secondary | ICD-10-CM

## 2016-12-03 DIAGNOSIS — S6291XD Unspecified fracture of right wrist and hand, subsequent encounter for fracture with routine healing: Principal | ICD-10-CM

## 2016-12-03 DIAGNOSIS — M25521 Pain in right elbow: Principal | ICD-10-CM
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
## 2016-12-06 ENCOUNTER — Encounter: Admit: 2016-12-06 | Discharge: 2016-12-06 | Payer: BC Managed Care – PPO

## 2016-12-06 ENCOUNTER — Encounter: Admit: 2016-12-06 | Discharge: 2016-12-07 | Payer: BC Managed Care – PPO

## 2016-12-06 DIAGNOSIS — Z419 Encounter for procedure for purposes other than remedying health state, unspecified: Principal | ICD-10-CM

## 2016-12-06 DIAGNOSIS — M79601 Pain in right arm: Principal | ICD-10-CM

## 2016-12-06 MED ORDER — HYDROCODONE-ACETAMINOPHEN 5-325 MG PO TAB
1 | ORAL_TABLET | ORAL | 0 refills | 30.00000 days | Status: AC | PRN
Start: 2016-12-06 — End: 2017-05-15

## 2016-12-07 ENCOUNTER — Encounter: Admit: 2016-12-07 | Discharge: 2016-12-07 | Payer: BC Managed Care – PPO

## 2016-12-07 DIAGNOSIS — S6291XD Unspecified fracture of right wrist and hand, subsequent encounter for fracture with routine healing: Principal | ICD-10-CM

## 2016-12-07 DIAGNOSIS — S53104D Unspecified dislocation of right ulnohumeral joint, subsequent encounter: ICD-10-CM

## 2016-12-12 ENCOUNTER — Ambulatory Visit: Admit: 2016-12-12 | Discharge: 2016-12-12 | Payer: BC Managed Care – PPO

## 2016-12-12 ENCOUNTER — Encounter: Admit: 2016-12-19 | Discharge: 2016-12-19 | Payer: BC Managed Care – PPO

## 2016-12-12 ENCOUNTER — Encounter: Admit: 2016-12-03 | Discharge: 2016-12-26 | Payer: BC Managed Care – PPO

## 2016-12-12 ENCOUNTER — Ambulatory Visit: Admit: 2016-12-12 | Discharge: 2016-12-13 | Payer: BC Managed Care – PPO

## 2016-12-12 DIAGNOSIS — S6291XD Unspecified fracture of right wrist and hand, subsequent encounter for fracture with routine healing: Principal | ICD-10-CM

## 2016-12-12 DIAGNOSIS — S5001XD Contusion of right elbow, subsequent encounter: Secondary | ICD-10-CM

## 2016-12-12 DIAGNOSIS — S53104D Unspecified dislocation of right ulnohumeral joint, subsequent encounter: Principal | ICD-10-CM
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
## 2016-12-19 DIAGNOSIS — S62609A Fracture of unspecified phalanx of unspecified finger, initial encounter for closed fracture: Secondary | ICD-10-CM
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
## 2016-12-26 ENCOUNTER — Encounter: Admit: 2016-12-12 | Discharge: 2016-12-12 | Payer: BC Managed Care – PPO

## 2016-12-26 DIAGNOSIS — S62609A Fracture of unspecified phalanx of unspecified finger, initial encounter for closed fracture: ICD-10-CM

## 2016-12-26 DIAGNOSIS — S42401A Unspecified fracture of lower end of right humerus, initial encounter for closed fracture: ICD-10-CM

## 2016-12-26 DIAGNOSIS — S62306A Unspecified fracture of fifth metacarpal bone, right hand, initial encounter for closed fracture: ICD-10-CM

## 2016-12-26 DIAGNOSIS — S5001XD Contusion of right elbow, subsequent encounter: ICD-10-CM

## 2016-12-26 DIAGNOSIS — S53104D Unspecified dislocation of right ulnohumeral joint, subsequent encounter: ICD-10-CM

## 2016-12-26 DIAGNOSIS — S62609D Fracture of unspecified phalanx of unspecified finger, subsequent encounter for fracture with routine healing: Secondary | ICD-10-CM

## 2016-12-26 DIAGNOSIS — S6291XD Unspecified fracture of right wrist and hand, subsequent encounter for fracture with routine healing: Principal | ICD-10-CM

## 2017-01-03 ENCOUNTER — Encounter: Admit: 2017-01-03 | Discharge: 2017-01-03 | Payer: BC Managed Care – PPO

## 2017-01-07 ENCOUNTER — Encounter: Admit: 2017-01-07 | Discharge: 2017-01-07 | Payer: BC Managed Care – PPO

## 2017-01-07 DIAGNOSIS — S6291XD Unspecified fracture of right wrist and hand, subsequent encounter for fracture with routine healing: ICD-10-CM

## 2017-01-07 DIAGNOSIS — M25521 Pain in right elbow: Principal | ICD-10-CM

## 2017-01-09 ENCOUNTER — Ambulatory Visit: Admit: 2017-01-09 | Discharge: 2017-01-09 | Payer: BC Managed Care – PPO

## 2017-01-09 DIAGNOSIS — M25521 Pain in right elbow: ICD-10-CM

## 2017-01-09 DIAGNOSIS — S62336D Displaced fracture of neck of fifth metacarpal bone, right hand, subsequent encounter for fracture with routine healing: ICD-10-CM

## 2017-01-09 DIAGNOSIS — S42401D Unspecified fracture of lower end of right humerus, subsequent encounter for fracture with routine healing: Principal | ICD-10-CM

## 2017-01-09 DIAGNOSIS — S6291XD Unspecified fracture of right wrist and hand, subsequent encounter for fracture with routine healing: Principal | ICD-10-CM

## 2017-01-09 NOTE — Progress Notes
Procedure:   OPEN REDUCTION INTERNAL FIXATION RIGHT FIFTH METACARPAL FRACTURE. CLOSED REDUCTION PERCUTANEOUS PINNING OF RIGHT SMALL AND RING FINGER DISTAL PHALANX. ???REPAIR LATERAL LIGAMENTOUS COMPLEX. 11/27/2016    Interval Hx:  41 year old female presenting now 6 weeks status post above.  Patient has been working hard with therapy and feels that she has been making progress.  Pain has been well controlled. There are no new complaints and has been compliant with the postoperative protocol. Denies any fevers, chills or any other general complaints.    PE:  Soft tissue envelope is healing appropriately.  10 degrees shy of full supination.  Full pronation.  Elbow range of motion 40 degrees to 115 degrees.  No evidence of instability.  Small finger MCP motion stiff as expected.  Digital range of motion index and long finger proximally 2 cm away from palm.  Neurologic function is grossly intact to the right upper limb, including Radial, Ulnar, Median, AIN, and PIN sensory-motor functions. No evidence of CRPS: no dystrophic changes, abnormal hair growth, skin sweating.  No evidence of dysvascular changes.      Imaging: X-rays taken of the right elbow were reviewed show well reduced ulnohumeral and radiocapitellar joint.  X-rays of the right hand were reviewed show stable fixation of comminuted metacarpal fracture,  partial progressive but incomplete healing is noted.      A/P:  41 year old female presenting for evaluation of the above now 6 weeks out.  Patient is progressing well and as expected after severe multilevel upper extremity injuries.  Continue working with therapy and we discussed this in detail for each injury.  He can return to work with 2 pound limitation.  We will see her back in 6 weeks.  Can DC elbow brace at this time.   We discussed the above findings with the patient. The patient is doing well and as expected after the above operation. If any problems or worsening symptoms should arise, the patient will contact my office as soon as possible for a more immediate evaluation.  The patient understands and agrees to this above plan.

## 2017-01-15 ENCOUNTER — Encounter: Admit: 2017-01-15 | Discharge: 2017-01-15 | Payer: BC Managed Care – PPO

## 2017-01-15 NOTE — Telephone Encounter
RN left message for pt to call office to discuss physical therapy progress/alternative locations. Pt had expressed she was not completely satisfied [facility] [place] at last visit.

## 2017-02-15 ENCOUNTER — Encounter: Admit: 2017-02-15 | Discharge: 2017-02-15 | Payer: BC Managed Care – PPO

## 2017-02-15 DIAGNOSIS — M79601 Pain in right arm: Principal | ICD-10-CM

## 2017-02-20 ENCOUNTER — Ambulatory Visit: Admit: 2017-02-20 | Discharge: 2017-02-20 | Payer: BC Managed Care – PPO

## 2017-02-20 ENCOUNTER — Encounter: Admit: 2017-02-20 | Discharge: 2017-02-20 | Payer: BC Managed Care – PPO

## 2017-02-20 DIAGNOSIS — S42401D Unspecified fracture of lower end of right humerus, subsequent encounter for fracture with routine healing: Principal | ICD-10-CM

## 2017-02-20 DIAGNOSIS — M79601 Pain in right arm: Principal | ICD-10-CM

## 2017-02-20 DIAGNOSIS — S62336D Displaced fracture of neck of fifth metacarpal bone, right hand, subsequent encounter for fracture with routine healing: ICD-10-CM

## 2017-02-20 NOTE — Progress Notes
Procedure:   OPEN REDUCTION INTERNAL FIXATION RIGHT FIFTH METACARPAL FRACTURE. CLOSED REDUCTION PERCUTANEOUS PINNING OF RIGHT SMALL AND RING FINGER DISTAL PHALANX. ???REPAIR LATERAL LIGAMENTOUS COMPLEX.   Date of surgery 11/27/2016      Interval Hx:  41 year old female presenting for evaluation status post above.  She has been working with therapy but is concerned she has not been making significant progress  Pain has been well controlled. There are no new complaints and has been compliant with the postoperative protocol. Denies any fevers, chills or any other general complaints.    PE:  Soft tissue envelope is healed nicely.  No evidence of infection.  10 degrees shy of full supination.  Full pronation.  Elbow range of motion 20 degrees to 130 degrees.  No evidence of instability.  Small finger MCP motion 0-30, PIP 0-60 - active same as passive  Digital range of motion index and long finger to palm.  Neurologic function is grossly intact to the right upper limb, including Radial, Ulnar, Median, AIN, and PIN sensory-motor functions. No evidence of CRPS: no dystrophic changes, abnormal hair growth, skin sweating.  No evidence of dysvascular changes.  ???      Neurologic function is grossly intact to the left upper limb, including Radial, Ulnar, Median, AIN, and PIN sensory-motor functions. No evidence of CRPS: no dystrophic changes, abnormal hair growth, skin sweating.  No evidence of dysvascular changes.      Imaging: X-rays taken today show well reduced ulnohumeral and radiocapitellar joint.  Interval healing noted at small finger metacarpal neck.      A/P:  41 year old female status post above now almost 3 months postop.  Had a long discussion with the patient regarding the specifics of her therapy.  I expect her to continue to improve and we reviewed her exercises in detail.  New therapy prescription given.  I will see her back in 3 months.   We discussed the above findings with the patient. The patient is doing well and as expected after the above operation. If any problems or worsening symptoms should arise, the patient will contact my office as soon as possible for a more immediate evaluation.  The patient understands and agrees to this above plan.

## 2017-05-13 ENCOUNTER — Encounter: Admit: 2017-05-13 | Discharge: 2017-05-13 | Payer: BC Managed Care – PPO

## 2017-05-13 DIAGNOSIS — M79601 Pain in right arm: Principal | ICD-10-CM

## 2017-05-15 ENCOUNTER — Encounter: Admit: 2017-05-15 | Discharge: 2017-05-15 | Payer: BC Managed Care – PPO

## 2017-05-15 ENCOUNTER — Ambulatory Visit: Admit: 2017-05-15 | Discharge: 2017-05-15 | Payer: BC Managed Care – PPO

## 2017-05-15 DIAGNOSIS — S62336D Displaced fracture of neck of fifth metacarpal bone, right hand, subsequent encounter for fracture with routine healing: ICD-10-CM

## 2017-05-15 DIAGNOSIS — S42401D Unspecified fracture of lower end of right humerus, subsequent encounter for fracture with routine healing: Principal | ICD-10-CM

## 2017-05-15 DIAGNOSIS — M79601 Pain in right arm: Principal | ICD-10-CM

## 2017-05-30 ENCOUNTER — Encounter: Admit: 2017-05-30 | Discharge: 2017-05-30 | Payer: BC Managed Care – PPO

## 2017-07-03 ENCOUNTER — Encounter: Admit: 2017-07-03 | Discharge: 2017-07-03 | Payer: BC Managed Care – PPO

## 2017-07-03 DIAGNOSIS — S62336D Displaced fracture of neck of fifth metacarpal bone, right hand, subsequent encounter for fracture with routine healing: Principal | ICD-10-CM

## 2017-07-10 ENCOUNTER — Ambulatory Visit: Admit: 2017-07-10 | Discharge: 2017-07-10 | Payer: BC Managed Care – PPO

## 2017-07-10 ENCOUNTER — Encounter: Admit: 2017-07-10 | Discharge: 2017-07-10 | Payer: BC Managed Care – PPO

## 2017-07-10 DIAGNOSIS — S62336D Displaced fracture of neck of fifth metacarpal bone, right hand, subsequent encounter for fracture with routine healing: Principal | ICD-10-CM

## 2017-09-20 ENCOUNTER — Encounter: Admit: 2017-09-20 | Discharge: 2017-09-20 | Payer: BC Managed Care – PPO

## 2017-09-20 DIAGNOSIS — S62336D Displaced fracture of neck of fifth metacarpal bone, right hand, subsequent encounter for fracture with routine healing: Principal | ICD-10-CM

## 2017-09-23 ENCOUNTER — Ambulatory Visit: Admit: 2017-09-23 | Discharge: 2017-09-24 | Payer: BC Managed Care – PPO

## 2017-09-23 ENCOUNTER — Encounter: Admit: 2017-09-23 | Discharge: 2017-09-23 | Payer: BC Managed Care – PPO

## 2017-09-23 DIAGNOSIS — S42401D Unspecified fracture of lower end of right humerus, subsequent encounter for fracture with routine healing: ICD-10-CM

## 2017-09-23 DIAGNOSIS — S62336D Displaced fracture of neck of fifth metacarpal bone, right hand, subsequent encounter for fracture with routine healing: Principal | ICD-10-CM

## 2017-09-30 ENCOUNTER — Encounter: Admit: 2017-09-30 | Discharge: 2017-09-30 | Payer: BC Managed Care – PPO

## 2017-09-30 DIAGNOSIS — S62306A Unspecified fracture of fifth metacarpal bone, right hand, initial encounter for closed fracture: Principal | ICD-10-CM

## 2017-09-30 DIAGNOSIS — S62636D Displaced fracture of distal phalanx of right little finger, subsequent encounter for fracture with routine healing: Principal | ICD-10-CM

## 2017-09-30 MED ORDER — CEFAZOLIN 1 GRAM IJ SOLR
2 g | Freq: Once | INTRAVENOUS | 0 refills | Status: CN
Start: 2017-09-30 — End: ?

## 2017-12-17 ENCOUNTER — Encounter: Admit: 2017-12-17 | Discharge: 2017-12-17 | Payer: BC Managed Care – PPO

## 2017-12-19 ENCOUNTER — Ambulatory Visit: Admit: 2017-12-19 | Discharge: 2017-12-19 | Payer: BC Managed Care – PPO

## 2017-12-20 ENCOUNTER — Encounter: Admit: 2017-12-20 | Discharge: 2017-12-20 | Payer: BC Managed Care – PPO

## 2017-12-20 ENCOUNTER — Ambulatory Visit: Admit: 2017-12-20 | Discharge: 2017-12-21 | Payer: BC Managed Care – PPO

## 2017-12-20 ENCOUNTER — Ambulatory Visit: Admit: 2017-12-20 | Discharge: 2017-12-20 | Payer: BC Managed Care – PPO

## 2017-12-20 DIAGNOSIS — S6291XD Unspecified fracture of right wrist and hand, subsequent encounter for fracture with routine healing: Principal | ICD-10-CM

## 2017-12-20 MED ORDER — LIDOCAINE 10ML BUPIVACAINE 10ML SYRINGE
0 refills | Status: DC
Start: 2017-12-20 — End: 2017-12-25

## 2017-12-20 MED ORDER — OXYCODONE 5 MG PO TAB
5-10 mg | Freq: Once | ORAL | 0 refills | Status: CP | PRN
Start: 2017-12-20 — End: ?

## 2017-12-20 MED ORDER — SODIUM CHLORIDE 0.9 % IR SOLN
0 refills | Status: DC
Start: 2017-12-20 — End: 2017-12-25

## 2017-12-20 MED ORDER — PHENYLEPHRINE IN 0.9% NACL(PF) 1 MG/10 ML (100 MCG/ML) IV SYRG
INTRAVENOUS | 0 refills | Status: DC
Start: 2017-12-20 — End: 2017-12-20

## 2017-12-20 MED ORDER — PROMETHAZINE 25 MG/ML IJ SOLN
6.25 mg | INTRAVENOUS | 0 refills | Status: DC | PRN
Start: 2017-12-20 — End: 2017-12-25

## 2017-12-20 MED ORDER — HALOPERIDOL LACTATE 5 MG/ML IJ SOLN
1 mg | Freq: Once | INTRAVENOUS | 0 refills | Status: AC | PRN
Start: 2017-12-20 — End: ?

## 2017-12-20 MED ORDER — HYDROMORPHONE (PF) 2 MG/ML IJ SYRG
.5-1 mg | INTRAVENOUS | 0 refills | Status: DC | PRN
Start: 2017-12-20 — End: 2017-12-25

## 2017-12-20 MED ORDER — LIDOCAINE (PF) 200 MG/10 ML (2 %) IJ SYRG
0 refills | Status: DC
Start: 2017-12-20 — End: 2017-12-20

## 2017-12-20 MED ORDER — LACTATED RINGERS IV SOLP
1000 mL | INTRAVENOUS | 0 refills | Status: DC
Start: 2017-12-20 — End: 2017-12-25

## 2017-12-20 MED ORDER — DEXAMETHASONE SODIUM PHOSPHATE 4 MG/ML IJ SOLN
INTRAVENOUS | 0 refills | Status: DC
Start: 2017-12-20 — End: 2017-12-20

## 2017-12-20 MED ORDER — FENTANYL CITRATE (PF) 50 MCG/ML IJ SOLN
50 ug | INTRAVENOUS | 0 refills | Status: DC | PRN
Start: 2017-12-20 — End: 2017-12-25

## 2017-12-20 MED ORDER — PROPOFOL INJ 10 MG/ML IV VIAL
0 refills | Status: DC
Start: 2017-12-20 — End: 2017-12-20

## 2017-12-20 MED ORDER — ONDANSETRON HCL (PF) 4 MG/2 ML IJ SOLN
4 mg | Freq: Once | INTRAVENOUS | 0 refills | Status: AC | PRN
Start: 2017-12-20 — End: ?

## 2017-12-20 MED ORDER — CEFAZOLIN 1 GRAM IJ SOLR
2 g | Freq: Once | INTRAVENOUS | 0 refills | Status: CP
Start: 2017-12-20 — End: ?

## 2017-12-20 MED ORDER — MIDAZOLAM 1 MG/ML IJ SOLN
INTRAVENOUS | 0 refills | Status: DC
Start: 2017-12-20 — End: 2017-12-20

## 2017-12-20 MED ORDER — FENTANYL CITRATE (PF) 50 MCG/ML IJ SOLN
0 refills | Status: DC
Start: 2017-12-20 — End: 2017-12-20

## 2017-12-20 MED ORDER — OXYCODONE 5 MG PO TAB
5 mg | ORAL_TABLET | ORAL | 0 refills | 6.00000 days | Status: AC | PRN
Start: 2017-12-20 — End: ?
  Filled 2017-12-20 (×2): qty 30, 5d supply, fill #1

## 2017-12-20 MED ORDER — FENTANYL CITRATE (PF) 50 MCG/ML IJ SOLN
25 ug | INTRAVENOUS | 0 refills | Status: DC | PRN
Start: 2017-12-20 — End: 2017-12-25

## 2017-12-20 MED ORDER — ONDANSETRON HCL (PF) 4 MG/2 ML IJ SOLN
INTRAVENOUS | 0 refills | Status: DC
Start: 2017-12-20 — End: 2017-12-20

## 2017-12-23 ENCOUNTER — Encounter: Admit: 2017-12-23 | Discharge: 2017-12-26 | Payer: BC Managed Care – PPO

## 2017-12-23 ENCOUNTER — Encounter: Admit: 2017-12-23 | Discharge: 2017-12-23 | Payer: BC Managed Care – PPO

## 2017-12-23 DIAGNOSIS — S62306S Unspecified fracture of fifth metacarpal bone, right hand, sequela: Principal | ICD-10-CM

## 2017-12-26 DIAGNOSIS — S6291XD Unspecified fracture of right wrist and hand, subsequent encounter for fracture with routine healing: ICD-10-CM

## 2018-01-01 ENCOUNTER — Encounter: Admit: 2018-01-01 | Discharge: 2018-01-25 | Payer: BC Managed Care – PPO

## 2018-01-01 ENCOUNTER — Encounter: Admit: 2018-01-01 | Discharge: 2018-01-01 | Payer: BC Managed Care – PPO

## 2018-01-01 ENCOUNTER — Ambulatory Visit: Admit: 2018-01-01 | Discharge: 2018-01-02 | Payer: BC Managed Care – PPO

## 2018-01-01 DIAGNOSIS — S62636D Displaced fracture of distal phalanx of right little finger, subsequent encounter for fracture with routine healing: Principal | ICD-10-CM

## 2018-01-01 DIAGNOSIS — Z9889 Other specified postprocedural states: ICD-10-CM

## 2018-01-01 DIAGNOSIS — S6291XD Unspecified fracture of right wrist and hand, subsequent encounter for fracture with routine healing: Principal | ICD-10-CM

## 2018-01-01 DIAGNOSIS — S62306S Unspecified fracture of fifth metacarpal bone, right hand, sequela: ICD-10-CM

## 2018-01-25 DIAGNOSIS — S62326D Displaced fracture of shaft of fifth metacarpal bone, right hand, subsequent encounter for fracture with routine healing: Principal | ICD-10-CM

## 2018-03-04 ENCOUNTER — Encounter: Admit: 2018-03-04 | Discharge: 2018-03-04 | Payer: BC Managed Care – PPO

## 2020-01-14 IMAGING — MG MAMMOGRAM 3D SCREEN, BILATERAL
12 of 15 series · 12 of 15 positions shown · non-contrast
Comparison: none

[R CC (1 of 2)]
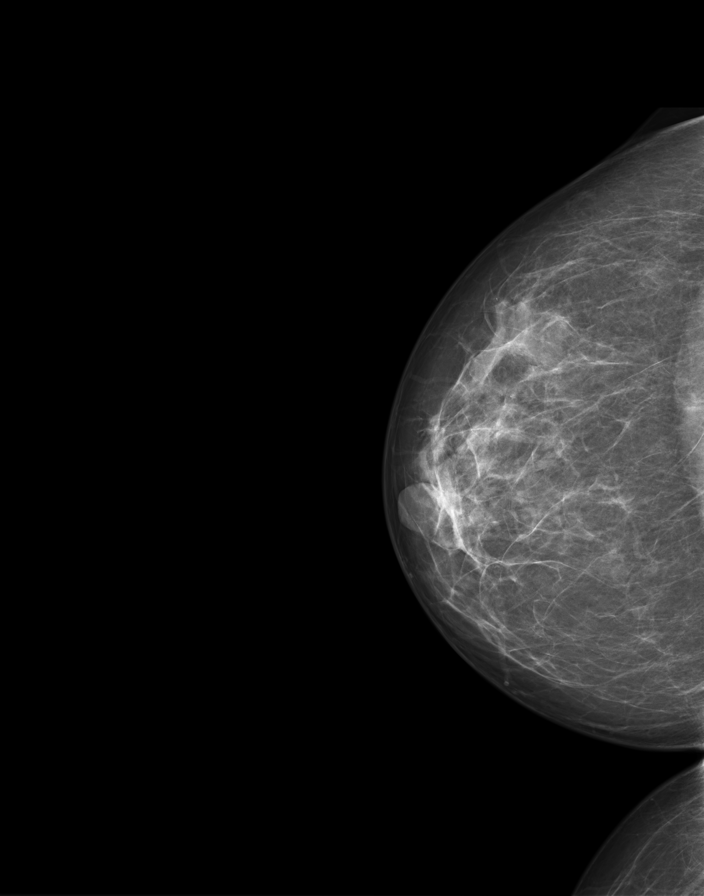

[R tomo (1 of 2)]
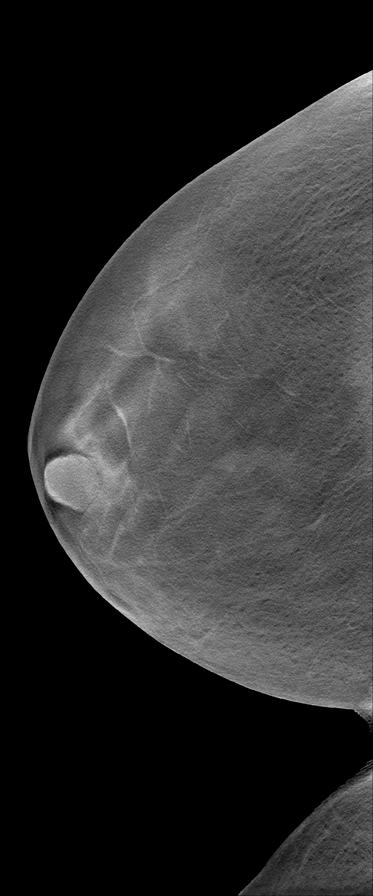

[R CC (2 of 2)]
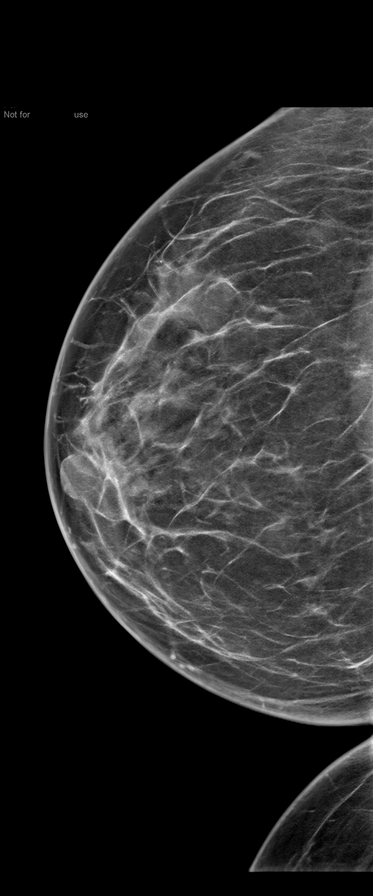

[R]
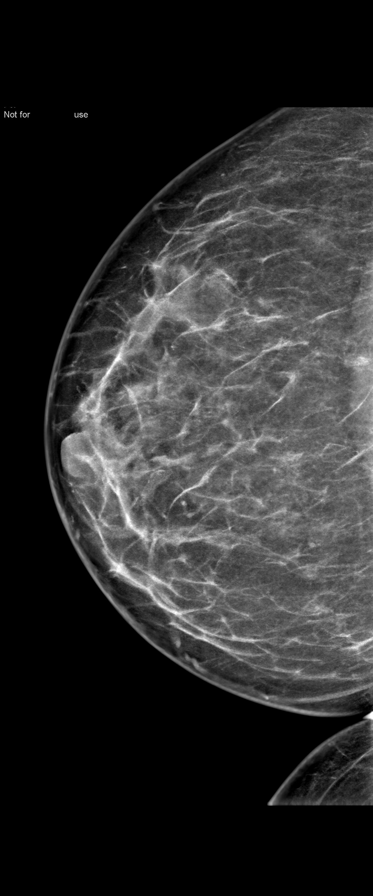

[L CC (1 of 2)]
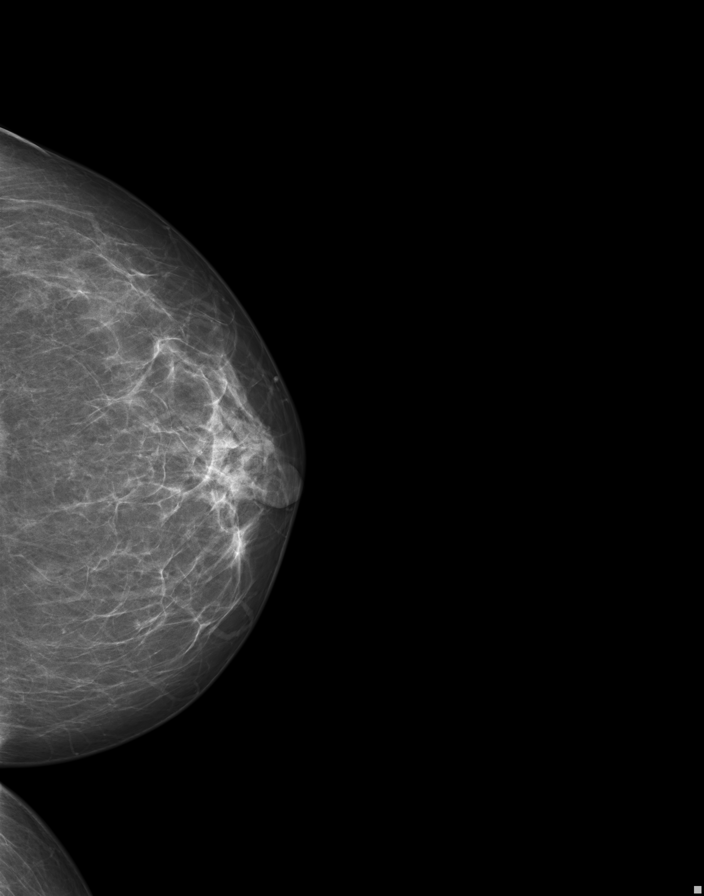

[L tomo]
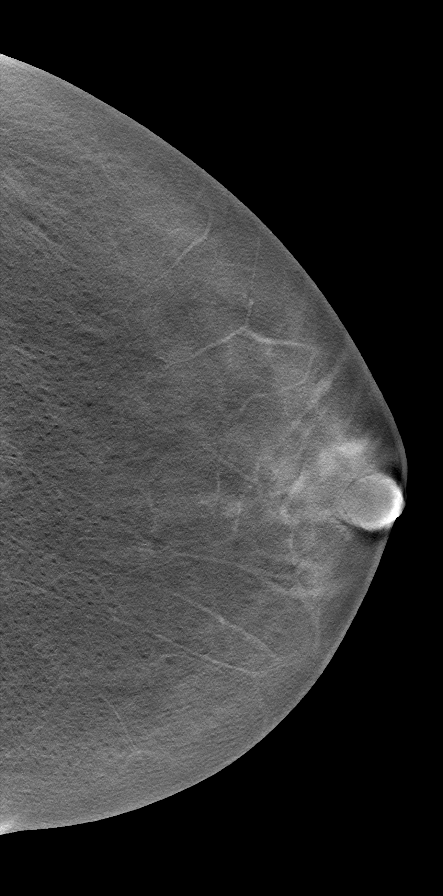

[L CC (2 of 2)]
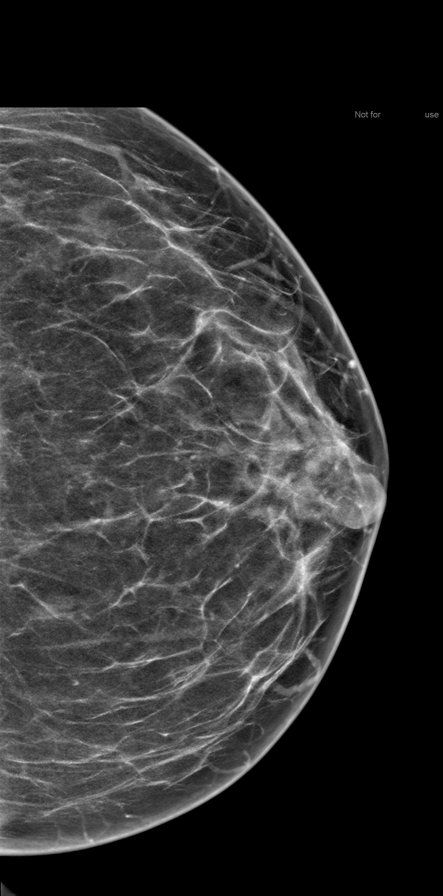

[L]
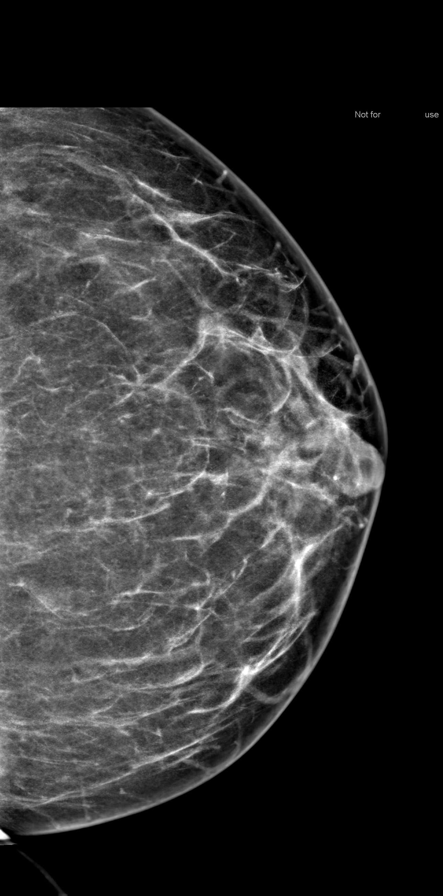

[R MLO (1 of 2)]
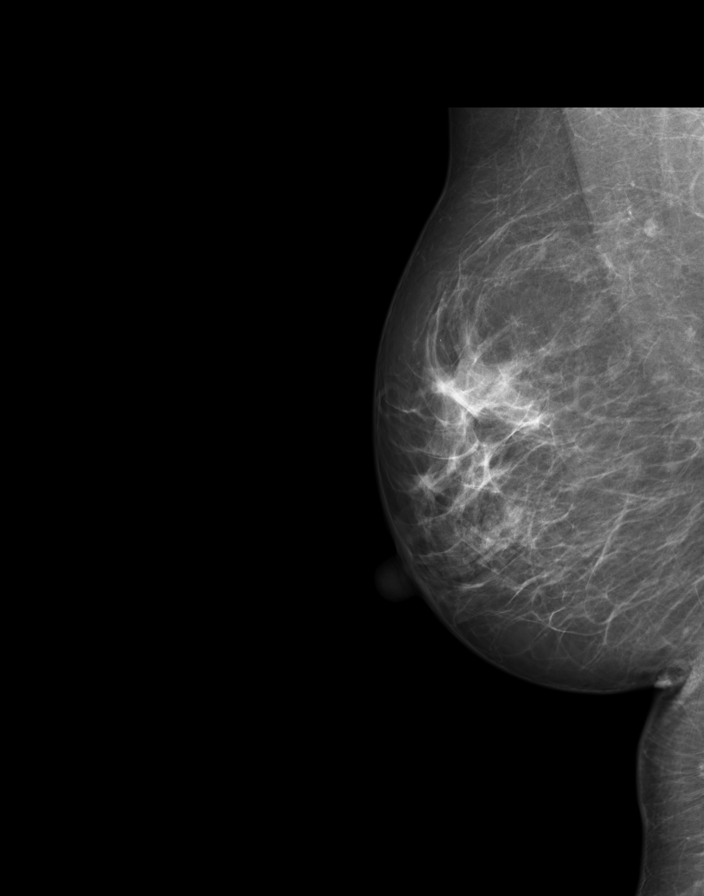

[R tomo (2 of 2)]
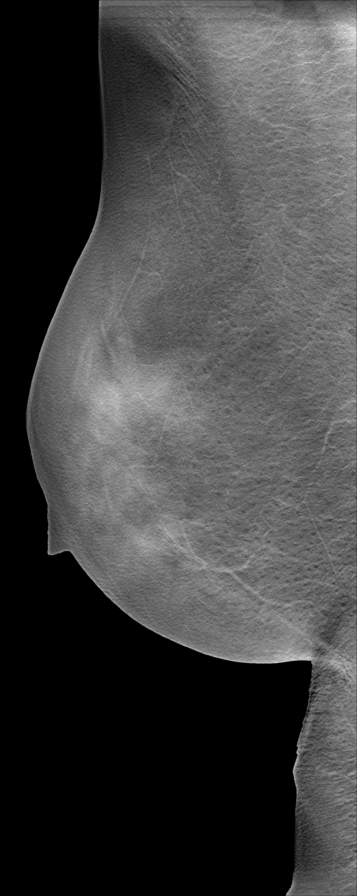

[R MLO (2 of 2)]
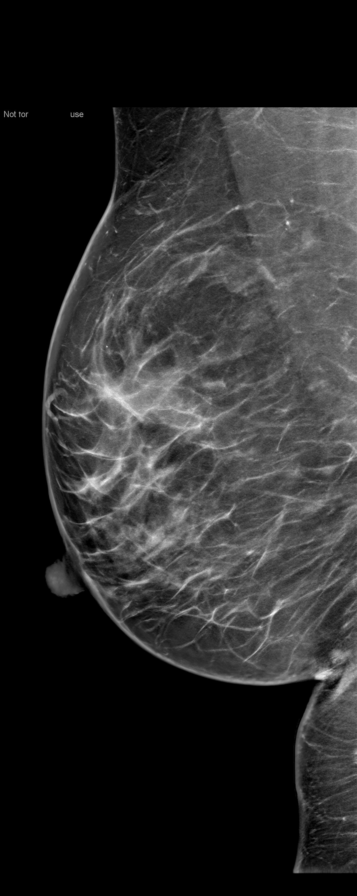

[L MLO]
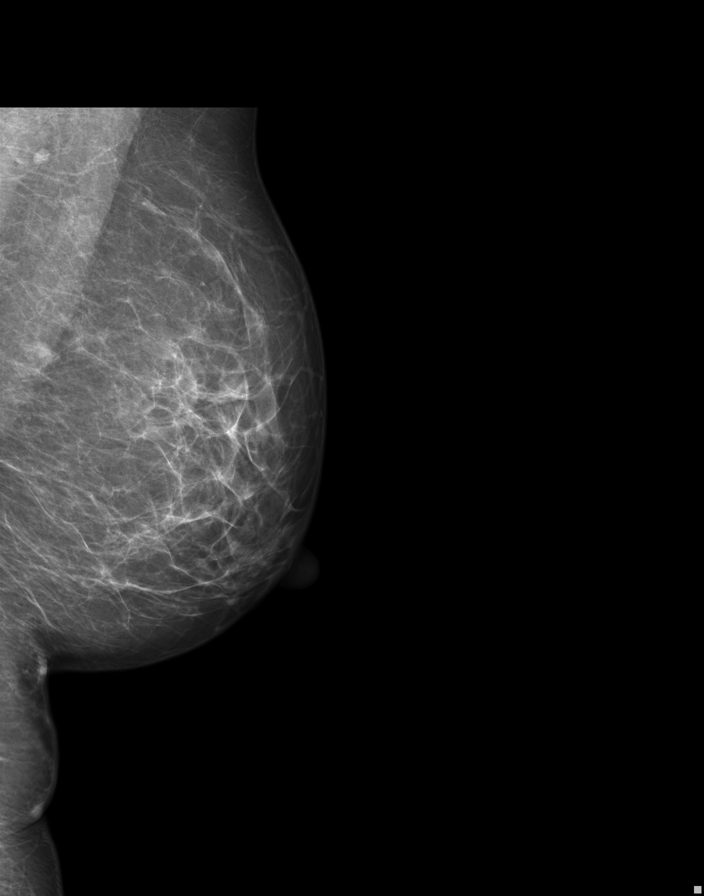

[12 of 15 positions shown; findings below may reference images not displayed]

EXAM

MAMMOGRAM

INDICATION

ATH screening
PAT AUNT DX @ 63.  SCREENING.  AB (3D)  PRIORS: 8338, [id number].

TECHNIQUE

2D and tomosynthesis digital craniocaudal and mediolateral oblique views were obtained of both
breasts. Computer aided detection software was utilized.

COMPARISONS

11/21/17

FINDINGS

There is scattered fibroglandular tissue.

[There is no suspicious microcalcification, architectural distortion, or spiculated mass.]

IMPRESSION
1. BI-RADS 1, NEGATIVE.

A reminder letter will be sent.

Tech Notes:
